# Patient Record
Sex: Female | Born: 1950
Health system: Southern US, Community
[De-identification: ages and names within clinical notes are randomized; demographics above are authoritative.]

## PROBLEM LIST (undated history)

## (undated) DIAGNOSIS — H524 Presbyopia: Secondary | ICD-10-CM

## (undated) DIAGNOSIS — H35369 Drusen (degenerative) of macula, unspecified eye: Secondary | ICD-10-CM

## (undated) DIAGNOSIS — M25552 Pain in left hip: Secondary | ICD-10-CM

## (undated) DIAGNOSIS — E878 Other disorders of electrolyte and fluid balance, not elsewhere classified: Secondary | ICD-10-CM

## (undated) DIAGNOSIS — M8580 Other specified disorders of bone density and structure, unspecified site: Secondary | ICD-10-CM

## (undated) DIAGNOSIS — R002 Palpitations: Secondary | ICD-10-CM

## (undated) HISTORY — DX: Palpitations: R00.2

## (undated) HISTORY — DX: Other specified disorders of bone density and structure, unspecified site: M85.80

## (undated) HISTORY — DX: Other disorders of electrolyte and fluid balance, not elsewhere classified: E87.8

## (undated) HISTORY — DX: Pain in left hip: M25.552

## (undated) HISTORY — DX: Presbyopia: H52.4

## (undated) HISTORY — DX: Drusen (degenerative) of macula, unspecified eye: H35.369

## (undated) NOTE — Progress Notes (Signed)
 Formatting of this note is different from the original. Pessary Follow-up  Chief Complaint  Patient presents with  ? Follow-up   Subjective:  76 y.o. [id number] here for follow-up of pessary use.    She has a size 2.5 Gellhorn pessary in place for uterovaginal prolapse Does not take pessary out herself Continues Trimo-San, per patient preference. Not using vaginal estrogen. No bleeding, no pain  [place] in September and staying active in her living community.   Patient's medications, allergies, past obstetric/gynecologic, medical, surgical, social and family histories were reviewed and updated as appropriate.  Review of Systems Review of Systems - History obtained from the patient Patient questionnaire reviewed and scanned into patient chart if completed, All others negative.  Objective:   Vitals:   12/08/23 1106  BP: 122/68  Weight: 67.1 kg (148 lb)  Height: 157.5 cm (5' 2)   General:     Alert and oriented to person, place, and time, no distress  Head:  Normocephalic, without obvious abnormality  Pelvic: External: normal vulva without lesions Urethral Meatus: without prolapse Vagina: atrophic, no abrasions or lesions Cervix: no lesions Prolapse: grade 3 uterovaginal prolapse  Extremities:  Extremities normal, atraumatic, no cyanosis or edema  Skin: Skin color normal.  No rashes, or lesions  Neurologic:  Cranial nerves grossly intact   Assessment:   Charlotte Villa is a 59 y.o. presenting for follow-up of pessary use  Plan:   Grade 3 uterovaginal prolapse - continue size 2.5 Gellhorn pessary - continue Trimo-San - no concerns at this time - follow-up in 3 months  I spent a total of 20 minutes on 12/08/2023 reviewing the record, performing a face to face visit, preparing, discussing the treatment, and creating documentation of the encounter. This time was separate from that spent performing other billable services.  Follow Up:    Follow up in about 3 months (around 03/09/2024) for follow-up.  Jinnie Blunt, MD  Electronically signed by Blunt Jinnie, MD at 12/08/2023 11:39 AM CDT

---

## 2020-11-29 ENCOUNTER — Other Ambulatory Visit: Payer: Self-pay

## 2020-11-29 DIAGNOSIS — N816 Rectocele: Secondary | ICD-10-CM | POA: Diagnosis not present

## 2020-11-29 DIAGNOSIS — N8110 Cystocele, unspecified: Secondary | ICD-10-CM | POA: Diagnosis not present

## 2020-11-29 DIAGNOSIS — Z01419 Encounter for gynecological examination (general) (routine) without abnormal findings: Secondary | ICD-10-CM | POA: Diagnosis not present

## 2020-11-29 DIAGNOSIS — Z4689 Encounter for fitting and adjustment of other specified devices: Secondary | ICD-10-CM | POA: Diagnosis not present

## 2020-11-29 DIAGNOSIS — Z8739 Personal history of other diseases of the musculoskeletal system and connective tissue: Secondary | ICD-10-CM | POA: Diagnosis not present

## 2020-11-29 DIAGNOSIS — N812 Incomplete uterovaginal prolapse: Secondary | ICD-10-CM | POA: Diagnosis not present

## 2020-11-29 MED ORDER — TRIMO-SAN 0.025-0.01 % VA GEL
1.0000 | VAGINAL | 2 refills | Status: DC
  Filled 2020-11-29: qty 113.4, 90d supply, fill #0
  Filled 2020-12-27: qty 113.4, 28d supply, fill #0

## 2020-12-03 ENCOUNTER — Other Ambulatory Visit: Payer: Self-pay

## 2020-12-04 ENCOUNTER — Other Ambulatory Visit: Payer: Self-pay | Admitting: Nurse Practitioner

## 2020-12-04 DIAGNOSIS — Z8739 Personal history of other diseases of the musculoskeletal system and connective tissue: Secondary | ICD-10-CM

## 2020-12-25 ENCOUNTER — Other Ambulatory Visit: Payer: Self-pay

## 2020-12-25 ENCOUNTER — Ambulatory Visit (INDEPENDENT_AMBULATORY_CARE_PROVIDER_SITE_OTHER): Payer: 59 | Admitting: Family Medicine

## 2020-12-25 VITALS — BP 118/80 | HR 93 | Ht 62.0 in | Wt 141.8 lb

## 2020-12-25 DIAGNOSIS — Z13220 Encounter for screening for lipoid disorders: Secondary | ICD-10-CM

## 2020-12-25 DIAGNOSIS — Z131 Encounter for screening for diabetes mellitus: Secondary | ICD-10-CM

## 2020-12-25 DIAGNOSIS — Z1159 Encounter for screening for other viral diseases: Secondary | ICD-10-CM

## 2020-12-25 DIAGNOSIS — Z7689 Persons encountering health services in other specified circumstances: Secondary | ICD-10-CM

## 2020-12-25 DIAGNOSIS — E878 Other disorders of electrolyte and fluid balance, not elsewhere classified: Secondary | ICD-10-CM | POA: Diagnosis not present

## 2020-12-25 NOTE — Progress Notes (Signed)
    SUBJECTIVE:   CHIEF COMPLAINT / HPI:   New patient visit  Current concerns None  PMH Long covid?-Patient reports she had COVID in June 2020, had issues with bilateral upper extremity weakness in winter 2020 and was seen by rheumatology who started her on taper of prednisone.  She took the prednisone for 8 months and symptoms resolved.  She has not taken it since then.  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 Occasional etoh, no other drugs. 4 grown sons. Exercises by walking at work. At least 4 miles per day.   Family history Maternal- adrenal cancer(not primary site but did not identify source)  Paternal- macular degeration, A fib, DVT Maternal aunt with lung cancer Maternal grandather with type 2 DM   Routine healthcare maintenance Colonoscopy-2021 Mammogram-2021 Pap smear-2009 DEXA scan-2021 Tetanus shot-2020 Pneumonia-2018 Shingles shot-2012 Echo-2019   OBJECTIVE:   BP 118/80   Pulse 93   Ht 5\' 2"  (1.575 m)   Wt 141 lb 12.8 oz (64.3 kg)   SpO2 95%   BMI 25.94 kg/m   Physical Exam Constitutional:      Appearance: She is normal weight.  HENT:     Head: Normocephalic and atraumatic.     Nose: Nose normal.  Eyes:     Extraocular Movements: Extraocular movements intact.     Pupils: Pupils are equal, round, and reactive to light.  Cardiovascular:     Rate and Rhythm: Normal rate and regular rhythm.  Pulmonary:     Effort: Pulmonary effort is normal.     Breath sounds: Normal breath sounds.  Abdominal:     General: Abdomen is flat. Bowel sounds are normal.     Palpations: Abdomen is soft.  Musculoskeletal:        General: No deformity. Normal range of motion.  Skin:    General: Skin is warm and dry.     Capillary Refill: Capillary refill takes less than 2 seconds.   Neurological:     General: No focal deficit present.     Mental Status: She is alert.     ASSESSMENT/PLAN:   Encounter to establish care Patient is here to establish care with new provider.  She has no current concerns at this time.  She is up-to-date on most of her vaccinations and screenings.  We will collect routine lipid panel screening for hyperlipidemia.  We will also check a BMP to assess kidney function and evaluate for electrolyte abnormalities.  We will also collect screening hepatitis labs.  Follow-up as needed.     , MD [facility]

## 2020-12-25 NOTE — Patient Instructions (Signed)
It was a pleasure meeting you today!  I am glad that you have no concerns today and you appear to be doing well.  We are checking some routine lab work such as a be met to assess your kidney function look at your electrolytes as well as a lipid panel to check your cholesterol.  We also screen all of our patients for hepatitis C.  I will call you if there are any abnormal results but otherwise we will post them to your MyChart.  Please activate your MyChart.  If you have any questions or concerns please feel free to call the clinic.  I hope you have a wonderful afternoon!

## 2020-12-25 NOTE — Assessment & Plan Note (Signed)
Patient is here to establish care with new provider.  She has no current concerns at this time.  She is up-to-date on most of her vaccinations and screenings.  We will collect routine lipid panel screening for hyperlipidemia.  We will also check a BMP to assess kidney function and evaluate for electrolyte abnormalities.  We will also collect screening hepatitis labs.  Follow-up as needed.

## 2020-12-26 ENCOUNTER — Telehealth: Payer: Self-pay | Admitting: Family Medicine

## 2020-12-26 ENCOUNTER — Encounter: Payer: Self-pay | Admitting: Family Medicine

## 2020-12-26 LAB — LIPID PANEL
Chol/HDL Ratio: 2.8 ratio (ref 0.0–4.4)
Cholesterol, Total: 236 mg/dL — ABNORMAL HIGH (ref 100–199)
HDL: 84 mg/dL (ref >39)
LDL Chol Calc (NIH): 131 mg/dL — ABNORMAL HIGH (ref 0–99)
Triglycerides: 120 mg/dL (ref 0–149)
VLDL Cholesterol Cal: 21 mg/dL (ref 5–40)

## 2020-12-26 LAB — BASIC METABOLIC PANEL
BUN/Creatinine Ratio: 17 (ref 12–28)
BUN: 11 mg/dL (ref 8–27)
CO2: 21 mmol/L (ref 20–29)
Calcium: 9.4 mg/dL (ref 8.7–10.3)
Chloride: 103 mmol/L (ref 96–106)
Creatinine, Ser: 0.66 mg/dL (ref 0.57–1.00)
Glucose: 119 mg/dL — ABNORMAL HIGH (ref 65–99)
Potassium: 3.7 mmol/L (ref 3.5–5.2)
Sodium: 140 mmol/L (ref 134–144)
eGFR: 94 mL/min/{1.73_m2} (ref >59)

## 2020-12-26 LAB — HEPATITIS C ANTIBODY: Hep C Virus Ab: <0.1 s/co ratio (ref 0.0–0.9)

## 2020-12-26 NOTE — Telephone Encounter (Signed)
Attempted to call patient regarding lab results.  Her cholesterol is mildly elevated in the 80s ASCVD risk calculator recommends initiating a statin mainly based off of her age.  Wanted to do shared decision making with the patient.  Will reattempt call later.

## 2020-12-27 ENCOUNTER — Other Ambulatory Visit: Payer: Self-pay

## 2020-12-27 DIAGNOSIS — Z4689 Encounter for fitting and adjustment of other specified devices: Secondary | ICD-10-CM | POA: Diagnosis not present

## 2020-12-28 ENCOUNTER — Other Ambulatory Visit: Payer: Self-pay

## 2020-12-28 ENCOUNTER — Ambulatory Visit
Admission: RE | Admit: 2020-12-28 | Discharge: 2020-12-28 | Disposition: A | Discharge status: Home / Self Care | Payer: 59 | Source: Ambulatory Visit | Attending: Nurse Practitioner | Admitting: Nurse Practitioner

## 2020-12-28 DIAGNOSIS — M8589 Other specified disorders of bone density and structure, multiple sites: Secondary | ICD-10-CM | POA: Diagnosis not present

## 2020-12-28 DIAGNOSIS — Z780 Asymptomatic menopausal state: Secondary | ICD-10-CM | POA: Diagnosis not present

## 2020-12-28 DIAGNOSIS — Z8739 Personal history of other diseases of the musculoskeletal system and connective tissue: Secondary | ICD-10-CM

## 2021-01-02 ENCOUNTER — Other Ambulatory Visit: Payer: Self-pay

## 2021-01-15 ENCOUNTER — Other Ambulatory Visit: Payer: Self-pay | Admitting: Nurse Practitioner

## 2021-02-27 DIAGNOSIS — Z4689 Encounter for fitting and adjustment of other specified devices: Secondary | ICD-10-CM | POA: Diagnosis not present

## 2021-04-04 ENCOUNTER — Other Ambulatory Visit: Payer: Self-pay | Admitting: Nurse Practitioner

## 2021-04-04 DIAGNOSIS — Z1231 Encounter for screening mammogram for malignant neoplasm of breast: Secondary | ICD-10-CM

## 2021-04-09 ENCOUNTER — Other Ambulatory Visit: Payer: Self-pay

## 2021-04-09 ENCOUNTER — Ambulatory Visit: Payer: Medicare Other | Attending: Internal Medicine

## 2021-04-09 DIAGNOSIS — Z23 Encounter for immunization: Secondary | ICD-10-CM

## 2021-04-09 MED ORDER — PFIZER COVID-19 VAC BIVALENT 30 MCG/0.3ML IM SUSP
INTRAMUSCULAR | 0 refills | Status: DC
  Filled 2021-04-09: qty 0.3, 1d supply, fill #0

## 2021-04-09 NOTE — Progress Notes (Signed)
   Covid-19 Vaccination Clinic  Name:  Charlotte Villa    MRN: [id number] DOB: [birth year]  04/09/2021  [name] was observed post Covid-19 immunization for 15 minutes without incident. She was provided with Vaccine Information Sheet and instruction to access the V-Safe system.   [name] was instructed to call 911 with any severe reactions post vaccine: Difficulty breathing  Swelling of face and throat  A fast heartbeat  A bad rash all over body  Dizziness and weakness   Immunizations Administered     Name Date Dose VIS Date Route   Pfizer Covid-19 Vaccine Bivalent Booster 04/09/2021  1:33 PM 0.3 mL 02/06/2021 Intramuscular   Manufacturer: [employer], [employer]   Lot: [id number]   NDC: [address]-0      Drusilla Kanner, PharmD, MBA Clinical Acute Care Pharmacist

## 2021-04-24 DIAGNOSIS — N8110 Cystocele, unspecified: Secondary | ICD-10-CM | POA: Diagnosis not present

## 2021-04-24 DIAGNOSIS — Z4689 Encounter for fitting and adjustment of other specified devices: Secondary | ICD-10-CM | POA: Diagnosis not present

## 2021-04-24 DIAGNOSIS — N816 Rectocele: Secondary | ICD-10-CM | POA: Diagnosis not present

## 2021-04-24 DIAGNOSIS — N812 Incomplete uterovaginal prolapse: Secondary | ICD-10-CM | POA: Diagnosis not present

## 2021-05-07 ENCOUNTER — Other Ambulatory Visit: Payer: Self-pay

## 2021-05-07 ENCOUNTER — Ambulatory Visit
Admission: RE | Admit: 2021-05-07 | Discharge: 2021-05-07 | Disposition: A | Discharge status: Home / Self Care | Payer: Medicare Other | Source: Ambulatory Visit | Attending: Nurse Practitioner | Admitting: Nurse Practitioner

## 2021-05-07 DIAGNOSIS — Z1231 Encounter for screening mammogram for malignant neoplasm of breast: Secondary | ICD-10-CM

## 2021-06-12 ENCOUNTER — Telehealth: Payer: Self-pay

## 2021-06-12 NOTE — Telephone Encounter (Signed)
Patient calls nurse line reporting flu like sxs for the last couple of days. Patient reports congestion with "yellowish" phlegm, body aches and "mild" chills. Patient reports she took a covid test yesterday and was negative.  Patient denies fevers, SOB, nausea/vomiting or diarrhea.   Patient scheduled for tomorrow for evaluation.

## 2021-06-13 ENCOUNTER — Other Ambulatory Visit: Payer: Self-pay

## 2021-06-13 ENCOUNTER — Ambulatory Visit (INDEPENDENT_AMBULATORY_CARE_PROVIDER_SITE_OTHER): Payer: 59 | Admitting: Family Medicine

## 2021-06-13 VITALS — BP 127/74 | HR 95 | Temp 98.5°F | Ht 62.0 in | Wt 140.4 lb

## 2021-06-13 DIAGNOSIS — J069 Acute upper respiratory infection, unspecified: Secondary | ICD-10-CM | POA: Diagnosis not present

## 2021-06-13 DIAGNOSIS — R6889 Other general symptoms and signs: Secondary | ICD-10-CM

## 2021-06-13 MED ORDER — BENZONATATE 100 MG PO CAPS: 100.0000 mg | ORAL_CAPSULE | Freq: Two times a day (BID) | ORAL | 0 refills | Status: DC | PRN

## 2021-06-13 NOTE — Progress Notes (Signed)
° ° °  SUBJECTIVE:   CHIEF COMPLAINT / HPI: cough  Scratchy throat and feeling unwell on Sunday morning.  Worse following day with nonproductie cough and chills.  Temp 98-98.2.  Runny nose.  Denies any chest pain, shortness of breath, nausea, vomiting or diarrhea.  No recent sick contacts.  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  PERTINENT  PMH / PSH:  None  OBJECTIVE:   BP 127/74    Pulse 95    Temp 98.5 F (36.9 C) (Oral)    Ht 5\' 2"  (1.575 m)    Wt 140 lb 6.4 oz (63.7 kg)    SpO2 94%    BMI 25.68 kg/m    General: Alert, no acute distress Cardio: Normal S1 and S2, RRR, no r/m/g Pulm: CTAB, normal work of breathing Abdomen: Bowel sounds normal. Abdomen soft and non-tender.  ASSESSMENT/PLAN:   Viral URI Likely viral etiology given rhinorrhea and lungs clear on auscultation. -COVID/FLU/RS today -Continue to self isolate until resulted -Symptomatic management -Tessalon Pearls 100 mg BID  -Continue to hydrate -Strict return precautions provided -Follow up with PCP as needed     Carollee Leitz, MD [place]

## 2021-06-13 NOTE — Patient Instructions (Addendum)
Thank you for coming to see me today. It was a pleasure.  COVID/ Flu and RSV testing today.  Will MyChart you result.  Honey tea for cough  Tessalon Pearls twice a day  Hydrate daily  If you develop fevers>100.5, shortness of breath, chest pain, palpitations, dizziness, abdominal pain, nausea, vomiting, diarrhea or cannot eat or drink then please go to the ER immediately.   Please follow-up with PCP as needed  If you have any questions or concerns, please do not hesitate to call the office at [phone].  Best,   Carollee Leitz, MD    Upper Respiratory Infection, Adult An upper respiratory infection (URI) affects the nose, throat, and upper airways that lead to the lungs. The most common type of URI is often called the common cold. URIs usually get better on their own, without medical treatment. What are the causes? A URI is caused by a germ (virus). You may catch these germs by: Breathing in droplets from an infected person's cough or sneeze. Touching something that has the germ on it (is contaminated) and then touching your mouth, nose, or eyes. What increases the risk? You are more likely to get a URI if: You are very young or very old. You have close contact with others, such as at work, school, or a health care facility. You smoke. You have long-term (chronic) heart or lung disease. You have a weakened disease-fighting system (immune system). You have nasal allergies or asthma. You have a lot of stress. You have poor nutrition. What are the signs or symptoms? Runny or stuffy (congested) nose. Cough. Sneezing. Sore throat. Headache. Feeling tired (fatigue). Fever. Not wanting to eat as much as usual. Pain in your forehead, behind your eyes, and over your cheekbones (sinus pain). Muscle aches. Redness or irritation of the eyes. Pressure in the ears or face. How is this treated? URIs usually get better on their own within 7-10 days. Medicines cannot cure URIs, but  your doctor may recommend certain medicines to help relieve symptoms, such as: Over-the-counter cold medicines. Medicines to reduce coughing (cough suppressants). Coughing is a type of defense against infection that helps to clear the nose, throat, windpipe, and lungs (respiratory system). Take these medicines only as told by your doctor. Medicines to lower your fever. Follow these instructions at home: Activity Rest as needed. If you have a fever, stay home from work or school until your fever is gone, or until your doctor says you may return to work or school. You should stay home until you cannot spread the infection anymore (you are not contagious). Your doctor may have you wear a face mask so you have less risk of spreading the infection. Relieving symptoms Rinse your mouth often with salt water. To make salt water, dissolve -1 tsp (3-6 g) of salt in 1 cup (237 mL) of warm water. Use a cool-mist humidifier to add moisture to the air. This can help you breathe more easily. Eating and drinking  Drink enough fluid to keep your pee (urine) pale yellow. Eat soups and other clear broths. General instructions  Take over-the-counter and prescription medicines only as told by your doctor. Do not smoke or use any products that contain nicotine or tobacco. If you need help quitting, ask your doctor. Avoid being where people are smoking (avoid secondhand smoke). Stay up to date on all your shots (immunizations), and get the flu shot every year. Keep all follow-up visits. How to prevent the spread of infection to others  Wash your hands with soap and water for at least 20 seconds. If you cannot use soap and water, use hand sanitizer. Avoid touching your mouth, face, eyes, or nose. Cough or sneeze into a tissue or your sleeve or elbow. Do not cough or sneeze into your hand or into the air. Contact a doctor if: You are getting worse, not better. You have any of these: A fever or chills. Brown  or red mucus in your nose. Yellow or brown fluid (discharge)coming from your nose. Pain in your face, especially when you bend forward. Swollen neck glands. Pain when you swallow. White areas in the back of your throat. Get help right away if: You have shortness of breath that gets worse. You have very bad or constant: Headache. Ear pain. Pain in your forehead, behind your eyes, and over your cheekbones (sinus pain). Chest pain. You have long-lasting (chronic) lung disease along with any of these: Making high-pitched whistling sounds when you breathe, most often when you breathe out (wheezing). Long-lasting cough (more than 14 days). Coughing up blood. A change in your usual mucus. You have a stiff neck. You have changes in your: Vision. Hearing. Thinking. Mood. These symptoms may be an emergency. Get help right away. Call 911. Do not wait to see if the symptoms will go away. Do not drive yourself to the hospital. Summary An upper respiratory infection (URI) is caused by a germ (virus). The most common type of URI is often called the common cold. URIs usually get better within 7-10 days. Take over-the-counter and prescription medicines only as told by your doctor. This information is not intended to replace advice given to you by your health care provider. Make sure you discuss any questions you have with your health care provider. Document Revised: 12/26/2020 Document Reviewed: 12/26/2020 Elsevier Patient Education  [place].

## 2021-06-14 LAB — COVID-19, FLU A+B AND RSV
Influenza A, NAA: NOT DETECTED
Influenza B, NAA: NOT DETECTED
RSV, NAA: NOT DETECTED
SARS-CoV-2, NAA: NOT DETECTED

## 2021-06-15 ENCOUNTER — Encounter: Payer: Self-pay | Admitting: Family Medicine

## 2021-06-18 ENCOUNTER — Encounter: Payer: Self-pay | Admitting: Family Medicine

## 2021-06-18 DIAGNOSIS — J069 Acute upper respiratory infection, unspecified: Secondary | ICD-10-CM | POA: Insufficient documentation

## 2021-06-18 NOTE — Assessment & Plan Note (Signed)
Likely viral etiology given rhinorrhea and lungs clear on auscultation. -COVID/FLU/RS today -Continue to self isolate until resulted -Symptomatic management -Tessalon Pearls 100 mg BID  -Continue to hydrate -Strict return precautions provided -Follow up with PCP as needed

## 2021-11-12 ENCOUNTER — Encounter: Payer: Self-pay | Admitting: *Deleted

## 2021-11-21 DIAGNOSIS — H43392 Other vitreous opacities, left eye: Secondary | ICD-10-CM | POA: Diagnosis not present

## 2021-11-21 DIAGNOSIS — H35363 Drusen (degenerative) of macula, bilateral: Secondary | ICD-10-CM | POA: Diagnosis not present

## 2021-11-21 DIAGNOSIS — H2513 Age-related nuclear cataract, bilateral: Secondary | ICD-10-CM | POA: Diagnosis not present

## 2021-11-21 DIAGNOSIS — H524 Presbyopia: Secondary | ICD-10-CM | POA: Diagnosis not present

## 2021-11-21 DIAGNOSIS — Z7952 Long term (current) use of systemic steroids: Secondary | ICD-10-CM | POA: Diagnosis not present

## 2021-11-25 ENCOUNTER — Encounter: Payer: Self-pay | Admitting: Family Medicine

## 2021-11-25 ENCOUNTER — Ambulatory Visit (INDEPENDENT_AMBULATORY_CARE_PROVIDER_SITE_OTHER): Payer: 59 | Admitting: Family Medicine

## 2021-11-25 ENCOUNTER — Ambulatory Visit
Admission: RE | Admit: 2021-11-25 | Discharge: 2021-11-25 | Disposition: A | Discharge status: Home / Self Care | Payer: 59 | Source: Ambulatory Visit | Attending: Family Medicine | Admitting: Family Medicine

## 2021-11-25 VITALS — BP 112/68 | HR 74 | Wt 139.0 lb

## 2021-11-25 DIAGNOSIS — M25552 Pain in left hip: Secondary | ICD-10-CM | POA: Diagnosis not present

## 2021-11-25 DIAGNOSIS — M25551 Pain in right hip: Secondary | ICD-10-CM | POA: Diagnosis not present

## 2021-11-25 MED ORDER — NAPROXEN 500 MG PO TABS: 500.0000 mg | ORAL_TABLET | Freq: Two times a day (BID) | ORAL | 0 refills | Status: DC

## 2021-11-25 MED ORDER — DICLOFENAC SODIUM 1 % EX GEL: 4.0000 g | Freq: Four times a day (QID) | CUTANEOUS | 2 refills | Status: DC

## 2021-11-25 NOTE — Patient Instructions (Addendum)
Thank you for coming to see me today. It was a pleasure.   We will get some labs today.  If they are abnormal or we need to do something about them, I will call you.  If they are normal, I will send you a message on MyChart (if it is active) or a letter in the mail.  If you don't hear from [place] in 2 weeks, please call the office at the number below.   I have placed an order for Hip x-ray.  Please go to [facility] [employer] [facility] or at [facility] to have this completed.  You do not need an appointment, but if you would like to call them beforehand, their number is [phone].  We will contact you with your results afterwards.   Continue Tylenol 1300 mg 3 times a day Continue Naproxen 500 mg twice daily.  Do not take any Aleve, ibuprofen or other products containing NSAIDs while you are on this medication. Start diclofenac gel 4 times a day as needed Use heat and ice as needed Gentle hip exercises to start after results of hip x-rays received.  Please follow-up with PCP in 1 to 2 weeks  I will be at the [facility] in [place].   The address is [address] [place] The number is [phone]  If you wish to continue your primary care services with me please call the above number to schedule appointment.  Just tell them that you are currently a patient of Dr. Claris Che to schedule an appointment.   If you have any questions or concerns, please do not hesitate to call the office at [phone].  Best,   Dana Allan, MD    Hip Exercises Ask your health care provider which exercises are safe for you. Do exercises exactly as told by your health care provider and adjust them as directed. It is normal to feel mild stretching, pulling, tightness, or discomfort as you do these exercises. Stop right away if you feel sudden pain or your pain gets worse. Do not begin these exercises until told by your health care provider. Stretching and  range-of-motion exercises These exercises warm up your muscles and joints and improve the movement and flexibility of your hip. These exercises also help to relieve pain, numbness, and tingling. You may be asked to limit your range of motion if you had a hip replacement. Talk to your health care provider about these restrictions. Hamstrings, supine  Lie on your back (supine position). Loop a belt or towel over the ball of your left / right foot. The ball of your foot is on the walking surface, right under your toes. Straighten your left / right knee and slowly pull on the belt or towel to raise your leg until you feel a gentle stretch behind your knee (hamstring). Do not let your knee bend while you do this. Keep your other leg flat on the floor. Hold this position for __________ seconds. Slowly return your leg to the starting position. Repeat __________ times. Complete this exercise __________ times a day. Hip rotation  Lie on your back on a firm surface. With your left / right hand, gently pull your left / right knee toward the shoulder that is on the same side of the body. Stop when your knee is pointing toward the ceiling. Hold your left / right ankle with your other hand. Keeping your knee steady, gently pull your left / right ankle toward your other shoulder  until you feel a stretch in your buttocks. Keep your hips and shoulders firmly planted while you do this stretch. Hold this position for __________ seconds. Repeat __________ times. Complete this exercise __________ times a day. Seated stretch This exercise is sometimes called hamstrings and adductors stretch. Sit on the floor with your legs stretched wide. Keep your knees straight during this exercise. Keeping your head and back in a straight line, bend at your waist to reach for your left foot (position A). You should feel a stretch in your right inner thigh (adductors). Hold this position for __________ seconds. Then slowly  return to the upright position. Keeping your head and back in a straight line, bend at your waist to reach forward (position B). You should feel a stretch behind both of your thighs and knees (hamstrings). Hold this position for __________ seconds. Then slowly return to the upright position. Keeping your head and back in a straight line, bend at your waist to reach for your right foot (position C). You should feel a stretch in your left inner thigh (adductors). Hold this position for __________ seconds. Then slowly return to the upright position. Repeat __________ times. Complete this exercise __________ times a day. Lunge This exercise stretches the muscles of the hip (hip flexors). Place your left / right knee on the floor and bend your other knee so that is directly over your ankle. You should be half-kneeling. Keep good posture with your head over your shoulders. Tighten your buttocks to point your tailbone downward. This will prevent your back from arching too much. You should feel a gentle stretch in the front of your left / right thigh and hip. If you do not feel a stretch, slide your other foot forward slightly and then slowly lunge forward with your chest up until your knee once again lines up over your ankle. Make sure your tailbone continues to point downward. Hold this position for __________ seconds. Slowly return to the starting position. Repeat __________ times. Complete this exercise __________ times a day. Strengthening exercises These exercises build strength and endurance in your hip. Endurance is the ability to use your muscles for a long time, even after they get tired. Bridge This exercise strengthens the muscles of your hip (hip extensors). Lie on your back on a firm surface with your knees bent and your feet flat on the floor. Tighten your buttocks muscles and lift your bottom off the floor until the trunk of your body and your hips are level with your thighs. Do not  arch your back. You should feel the muscles working in your buttocks and the back of your thighs. If you do not feel these muscles, slide your feet 1-2 inches (2.5-5 cm) farther away from your buttocks. Hold this position for __________ seconds. Slowly lower your hips to the starting position. Let your muscles relax completely between repetitions. Repeat __________ times. Complete this exercise __________ times a day. Straight leg raises, side-lying This exercise strengthens the muscles that move the hip joint away from the center of the body (hip abductors). Lie on your side with your left / right leg in the top position. Lie so your head, shoulder, hip, and knee line up. You may bend your bottom knee slightly to help you balance. Roll your hips slightly forward, so your hips are stacked directly over each other and your left / right knee is facing forward. Leading with your heel, lift your top leg 4-6 inches (10-15 cm). You should feel the  muscles in your top hip lifting. Do not let your foot drift forward. Do not let your knee roll toward the ceiling. Hold this position for __________ seconds. Slowly return to the starting position. Let your muscles relax completely between repetitions. Repeat __________ times. Complete this exercise __________ times a day. Straight leg raises, side-lying This exercise strengthens the muscles that move the hip joint toward the center of the body (hip adductors). Lie on your side with your left / right leg in the bottom position. Lie so your head, shoulder, hip, and knee line up. You may place your upper foot in front to help you balance. Roll your hips slightly forward, so your hips are stacked directly over each other and your left / right knee is facing forward. Tense the muscles in your inner thigh and lift your bottom leg 4-6 inches (10-15 cm). Hold this position for __________ seconds. Slowly return to the starting position. Let your muscles relax  completely between repetitions. Repeat __________ times. Complete this exercise __________ times a day. Straight leg raises, supine This exercise strengthens the muscles in the front of your thigh (quadriceps). Lie on your back (supine position) with your left / right leg extended and your other knee bent. Tense the muscles in the front of your left / right thigh. You should see your kneecap slide up or see increased dimpling just above your knee. Keep these muscles tight as you raise your leg 4-6 inches (10-15 cm) off the floor. Do not let your knee bend. Hold this position for __________ seconds. Keep these muscles tense as you lower your leg. Relax the muscles slowly and completely between repetitions. Repeat __________ times. Complete this exercise __________ times a day. Hip abductors, standing This exercise strengthens the muscles that move the leg and hip joint away from the center of the body (hip abductors). Tie one end of a rubber exercise band or tubing to a secure surface, such as a chair, table, or pole. Loop the other end of the band or tubing around your left / right ankle. Keeping your ankle with the band or tubing directly opposite the secured end, step away until there is tension in the tubing or band. Hold on to a chair, table, or pole as needed for balance. Lift your left / right leg out to your side. While you do this: Keep your back upright. Keep your shoulders over your hips. Keep your toes pointing forward. Make sure to use your hip muscles to slowly lift your leg. Do not tip your body or forcefully lift your leg. Hold this position for __________ seconds. Slowly return to the starting position. Repeat __________ times. Complete this exercise __________ times a day. Squats This exercise strengthens the muscles in the front of your thigh (quadriceps). Stand in a door frame so your feet and knees are in line with the frame. You may place your hands on the frame for  balance. Slowly bend your knees and lower your hips like you are going to sit in a chair. Keep your lower legs in a straight-up-and-down position. Do not let your hips go lower than your knees. Do not bend your knees lower than told by your health care provider. If your hip pain increases, do not bend as low. Hold this position for ___________ seconds. Slowly push with your legs to return to standing. Do not use your hands to pull yourself to standing. Repeat __________ times. Complete this exercise __________ times a day. This information is not  intended to replace advice given to you by your health care provider. Make sure you discuss any questions you have with your health care provider. Document Revised: 10/10/2020 Document Reviewed: 10/10/2020 Elsevier Patient Education  [place].

## 2021-11-25 NOTE — Progress Notes (Signed)
    SUBJECTIVE:   CHIEF COMPLAINT / HPI: Left hip pain  Patient reports worsening left hip pain x1 month.  Woke up 1 morning with pain.  Has tried Tylenol and ibuprofen which had not helped.  She recently started using them in combination about a week ago and it has helped intermittently.  Denies any fevers, recent injury or previous injury, new exercises, incontinence of bowel or bladder, lower extremity weakness, numbness or tingling.  Reports bilateral upper extremity weakness.  States that she has been diagnosed with PMR in the past and has been on prednisone 7.5 mg daily until 2021 when it was discontinued by rheumatologist.  She denies any joint swelling, redness or stiffness.  PERTINENT  PMH / PSH:  History of PMR  OBJECTIVE:   BP 112/68   Pulse 74   Wt 139 lb (63 kg)   SpO2 97%   BMI 25.42 kg/m    General: Alert, no acute distress Bilateral arm exam No deformity. No tenderness to palpation FROM with 5/5 strength: Special Tests: Negative Tinel's sign, negative Phalen's test Neurovascular: Intact Sensation intact  Back - Normal skin, Spine with normal alignment and no deformity.  No tenderness to vertebral process palpation.  Paraspinous muscles are not tender and without spasm.   Range of motion is full at neck and lumbar sacral regions   Bilateral hip exam No deformity. FROM with 5/5 strength. No tenderness to palpation. Neurovascular: Intact Negative logroll Negative faber, fadir, and piriformis stretches.    ASSESSMENT/PLAN:   Left hip pain Suspect DJD given improvement with Tylenol and ibuprofen.  Most recent DEXA scan showed osteopenia.  Currently on vitamin D.  Given history of long-term steroid use possible fracture but doubt this given no recent injury. -Bilateral hip x-ray -Continue Tylenol 1300 mg 3 times daily -Start Naproxen 500 mg twice daily -Diclofenac gel 4 times daily as needed -CBC, CMET, CRP, CK, ESR, RF, anti-CCP, TSH to rule out  PMR. -Follow-up with results of hip x-ray -Consider physical therapy if normal x-ray results. -Follow-up with PCP in 1-2 weeks.     Carollee Leitz, MD [place]

## 2021-11-26 ENCOUNTER — Encounter: Payer: Self-pay | Admitting: Family Medicine

## 2021-11-26 DIAGNOSIS — M25552 Pain in left hip: Secondary | ICD-10-CM | POA: Insufficient documentation

## 2021-11-26 LAB — COMPREHENSIVE METABOLIC PANEL
ALT: 11 IU/L (ref 0–32)
AST: 20 IU/L (ref 0–40)
Albumin/Globulin Ratio: 2.3 — ABNORMAL HIGH (ref 1.2–2.2)
Albumin: 4.5 g/dL (ref 3.7–4.7)
Alkaline Phosphatase: 54 IU/L (ref 44–121)
BUN/Creatinine Ratio: 18 (ref 12–28)
BUN: 12 mg/dL (ref 8–27)
Bilirubin Total: 0.4 mg/dL (ref 0.0–1.2)
CO2: 19 mmol/L — ABNORMAL LOW (ref 20–29)
Calcium: 9.5 mg/dL (ref 8.7–10.3)
Chloride: 102 mmol/L (ref 96–106)
Creatinine, Ser: 0.68 mg/dL (ref 0.57–1.00)
Globulin, Total: 2 g/dL (ref 1.5–4.5)
Glucose: 90 mg/dL (ref 70–99)
Potassium: 4.7 mmol/L (ref 3.5–5.2)
Sodium: 135 mmol/L (ref 134–144)
Total Protein: 6.5 g/dL (ref 6.0–8.5)
eGFR: 92 mL/min/{1.73_m2} (ref >59)

## 2021-11-26 LAB — CBC WITH DIFFERENTIAL/PLATELET
Basophils Absolute: 0 10*3/uL (ref 0.0–0.2)
Basos: 1 %
EOS (ABSOLUTE): 0 10*3/uL (ref 0.0–0.4)
Eos: 1 %
Hematocrit: 34.2 % (ref 34.0–46.6)
Hemoglobin: 11.5 g/dL (ref 11.1–15.9)
Immature Grans (Abs): 0 10*3/uL (ref 0.0–0.1)
Immature Granulocytes: 0 %
Lymphocytes Absolute: 1.1 10*3/uL (ref 0.7–3.1)
Lymphs: 32 %
MCH: 32.3 pg (ref 26.6–33.0)
MCHC: 33.6 g/dL (ref 31.5–35.7)
MCV: 96 fL (ref 79–97)
Monocytes Absolute: 0.4 10*3/uL (ref 0.1–0.9)
Monocytes: 10 %
Neutrophils Absolute: 2 10*3/uL (ref 1.4–7.0)
Neutrophils: 56 %
Platelets: 200 10*3/uL (ref 150–450)
RBC: 3.56 x10E6/uL — ABNORMAL LOW (ref 3.77–5.28)
RDW: 12.2 % (ref 11.7–15.4)
WBC: 3.5 10*3/uL (ref 3.4–10.8)

## 2021-11-26 LAB — C-REACTIVE PROTEIN: CRP: 2 mg/L (ref 0–10)

## 2021-11-26 LAB — SEDIMENTATION RATE: Sed Rate: 3 mm/hr (ref 0–40)

## 2021-11-26 LAB — RHEUMATOID FACTOR: Rheumatoid fact SerPl-aCnc: 11.2 IU/mL (ref <14.0)

## 2021-11-26 LAB — CK: Total CK: 41 U/L (ref 32–182)

## 2021-11-26 NOTE — Assessment & Plan Note (Addendum)
Suspect DJD given improvement with Tylenol and ibuprofen.  Most recent DEXA scan showed osteopenia.  Currently on vitamin D.  Given history of long-term steroid use possible fracture but doubt this given no recent injury. -Bilateral hip x-ray -Continue Tylenol 1300 mg 3 times daily -Start Naproxen 500 mg twice daily -Diclofenac gel 4 times daily as needed -CBC, CMET, CRP, CK, ESR, RF, anti-CCP, TSH to rule out PMR. -Follow-up with results of hip x-ray -Consider physical therapy if normal x-ray results. -Follow-up with PCP in 1-2 weeks.

## 2021-12-04 DIAGNOSIS — Z4689 Encounter for fitting and adjustment of other specified devices: Secondary | ICD-10-CM | POA: Diagnosis not present

## 2021-12-04 DIAGNOSIS — N812 Incomplete uterovaginal prolapse: Secondary | ICD-10-CM | POA: Diagnosis not present

## 2021-12-04 DIAGNOSIS — Z01419 Encounter for gynecological examination (general) (routine) without abnormal findings: Secondary | ICD-10-CM | POA: Diagnosis not present

## 2021-12-04 LAB — ANTI-CCP AB, IGG + IGA (RDL): Anti-CCP Ab, IgG + IgA (RDL): <20 U

## 2021-12-04 LAB — TSH RFX ON ABNORMAL TO FREE T4: TSH: 2.31 u[IU]/mL (ref 0.450–4.500)

## 2021-12-12 ENCOUNTER — Encounter: Payer: Self-pay | Admitting: Student

## 2021-12-12 DIAGNOSIS — M8580 Other specified disorders of bone density and structure, unspecified site: Secondary | ICD-10-CM | POA: Insufficient documentation

## 2021-12-12 DIAGNOSIS — D649 Anemia, unspecified: Secondary | ICD-10-CM | POA: Insufficient documentation

## 2021-12-12 DIAGNOSIS — I341 Nonrheumatic mitral (valve) prolapse: Secondary | ICD-10-CM | POA: Insufficient documentation

## 2021-12-12 DIAGNOSIS — E785 Hyperlipidemia, unspecified: Secondary | ICD-10-CM | POA: Insufficient documentation

## 2021-12-23 ENCOUNTER — Ambulatory Visit (INDEPENDENT_AMBULATORY_CARE_PROVIDER_SITE_OTHER): Payer: 59 | Admitting: Family Medicine

## 2021-12-23 ENCOUNTER — Ambulatory Visit: Payer: 59

## 2021-12-23 VITALS — BP 142/72 | HR 86 | Ht 62.0 in | Wt 140.0 lb

## 2021-12-23 DIAGNOSIS — M25552 Pain in left hip: Secondary | ICD-10-CM | POA: Diagnosis not present

## 2021-12-23 NOTE — Assessment & Plan Note (Signed)
Patient's left hip exam seems to be more of a sartorius injury.  Different than usual.  We discussed with patient about thigh compression, home exercises, we discussed avoiding certain repetitive activities at the moment.  Patient's primary care has done relatively unremarkable work-up already at this moment.  We discussed other formal physical therapy but patient work with athletic trainer to learn home exercises in greater detail.  Worsening pain can consider advanced imaging.  Follow-up with me again in 6 to 8 weeks.

## 2021-12-23 NOTE — Progress Notes (Unsigned)
[facility] [address] [place] [address] Phone: [phone] Subjective:   Bruce Donath, am serving as a scribe for Dr. Antoine Primas.   I'm seeing this patient by the request  of:  Dana Allan, MD  CC: Left hip pain  [id number]  Bradleigh A Hartl is a 71 y.o. female coming in with complaint of L hip pain. Patient states that her pain began in May. Insidious onset. Located in the groin. Using Tylenol arthritis and volatren gel. Patient said that lying on side increases her pain. Patient walks a lot at work.   Xray hip 11/25/2021 FINDINGS: There is no evidence of hip fracture or dislocation of the hips. No acute displaced fracture or diastasis of the bones of pelvis. No aggressive appearing osseous lesion. Degenerative changes of the visualized lower lumbar spine.   IMPRESSION: Negative for acute traumatic injury. Reviewed the images in their entirety and agree with the findings.  Degenerative disc disease of the lumbar spine is noted    No past medical history on file. No past surgical history on file. Social History   Socioeconomic History   Marital status: Divorced    Spouse name: Not on file   Number of children: Not on file   Years of education: Not on file   Highest education level: Not on file  Occupational History   Not on file  Tobacco Use   Smoking status: Never    Passive exposure: Never   Smokeless tobacco: Never  Substance and Sexual Activity   Alcohol use: Not on file   Drug use: Not on file   Sexual activity: Not on file  Other Topics Concern   Not on file  Social History Narrative   Not on file   Social Determinants of Health   Financial Resource Strain: Not on file  Food Insecurity: Not on file  Transportation Needs: Not on file  Physical Activity: Not on file  Stress: Not on file  Social Connections: Not on file   No Known Allergies No family history on file.    Current Outpatient  Medications (Respiratory):    benzonatate (TESSALON) 100 MG capsule, Take 1 capsule (100 mg total) by mouth 2 (two) times daily as needed for cough.  Current Outpatient Medications (Analgesics):    naproxen (NAPROSYN) 500 MG tablet, Take 1 tablet (500 mg total) by mouth 2 (two) times daily with a meal.   Current Outpatient Medications (Other):    COVID-19 mRNA bivalent vaccine, Pfizer, (PFIZER COVID-19 VAC BIVALENT) injection, Inject into the muscle.   diclofenac Sodium (VOLTAREN) 1 % GEL, Apply 4 g topically 4 (four) times daily.   Reviewed prior external information including notes and imaging from  primary care provider As well as notes that were available from care everywhere and other healthcare systems.  Reviewed patient's primary care visits and treatment that has already been established for her hip pain  Past medical history, social, surgical and family history all reviewed in electronic medical record.  No pertanent information unless stated regarding to the chief complaint.   Review of Systems:  No headache, visual changes, nausea, vomiting, diarrhea, constipation, dizziness, abdominal pain, skin rash, fevers, chills, night sweats, weight loss, swollen lymph nodes, body aches, joint swelling, chest pain, shortness of breath, mood changes. POSITIVE muscle aches  Objective  Blood pressure (!) 142/72, pulse 86, height 5\' 2"  (1.575 m), weight 140 lb (63.5 kg), SpO2 99 %.   General: No apparent distress alert and  oriented x3 mood and affect normal, dressed appropriately.  HEENT: Pupils equal, extraocular movements intact  Respiratory: Patient's speak in full sentences and does not appear short of breath  Cardiovascular: No lower extremity edema, non tender, no erythema  Left hip exam seems to be more on the anterior aspect.  Patient does have good internal and external range of motion though noted.  Negative straight leg test.  Patient though does have pain with external rotation of  the hip with forward flexion against resistance.  Patient also states that there is some discomfort noted at the medial aspect of the knee.  Neurovascular intact distally.  Deep tendon reflexes intact   [address]; 15 additional minutes spent for Therapeutic exercises as stated in above notes.  This included exercises focusing on stretching, strengthening, with significant focus on eccentric aspects.   Long term goals include an improvement in range of motion, strength, endurance as well as avoiding reinjury. Patient's frequency would include in 1-2 times a day, 3-5 times a week for a duration of 6-12 weeks.  Hip strengthening exercises which included:  Pelvic tilt/bracing to help with proper recruitment of the lower abs and pelvic floor muscles  Glute strengthening to properly contract glutes without over-engaging low back and hamstrings - prone hip extension and glute bridge exercises Proper stretching techniques to increase effectiveness for the hip flexors, groin, quads, piriformic and low back when appropriate  Proper technique shown and discussed handout in great detail with ATC.  All questions were discussed and answered.     Impression and Recommendations:    The above documentation has been reviewed and is accurate and complete Judi Saa, DO

## 2021-12-23 NOTE — Patient Instructions (Signed)
Thigh compression sleeve Ice 20x 2x a day Decrease Tyelnol 650mg  daily Voltaren gel See me again in 6 weeks

## 2022-02-03 NOTE — Progress Notes (Unsigned)
[facility] [address] [place] [address] Phone: [phone] Subjective:   INadine Counts, am serving as a scribe for Dr. Antoine Primas.  I'm seeing this patient by the request  of:  Dana Allan, MD  CC: Left hip pain follow-up  [id number]  12/23/2021 Patient's left hip exam seems to be more of a sartorius injury.  Different than usual.  We discussed with patient about thigh compression, home exercises, we discussed avoiding certain repetitive activities at the moment.  Patient's primary care has done relatively unremarkable work-up already at this moment.  We discussed other formal physical therapy but patient work with athletic trainer to learn home exercises in greater detail.  Worsening pain can consider advanced imaging.  Follow-up with me again in 6 to 8 weeks.  Update 02/04/2022 JELESA MANGINI is a 71 y.o. female coming in with complaint of L hip pain. Patient states hip is feeling much better. Not sure why, but feeling great. Only time having pain is when laying on left side for too long, will wake her at night. Exercises causing some knee discomfort. Talked with her about modifying stretches.       No past medical history on file. No past surgical history on file. Social History   Socioeconomic History   Marital status: Divorced    Spouse name: Not on file   Number of children: Not on file   Years of education: Not on file   Highest education level: Not on file  Occupational History   Not on file  Tobacco Use   Smoking status: Never    Passive exposure: Never   Smokeless tobacco: Never  Substance and Sexual Activity   Alcohol use: Not on file   Drug use: Not on file   Sexual activity: Not on file  Other Topics Concern   Not on file  Social History Narrative   Not on file   Social Determinants of Health   Financial Resource Strain: Not on file  Food Insecurity: Not on file  Transportation Needs: Not on file   Physical Activity: Not on file  Stress: Not on file  Social Connections: Not on file   No Known Allergies No family history on file.    Current Outpatient Medications (Respiratory):    benzonatate (TESSALON) 100 MG capsule, Take 1 capsule (100 mg total) by mouth 2 (two) times daily as needed for cough.  Current Outpatient Medications (Analgesics):    naproxen (NAPROSYN) 500 MG tablet, Take 1 tablet (500 mg total) by mouth 2 (two) times daily with a meal.   Current Outpatient Medications (Other):    COVID-19 mRNA bivalent vaccine, Pfizer, (PFIZER COVID-19 VAC BIVALENT) injection, Inject into the muscle.   diclofenac Sodium (VOLTAREN) 1 % GEL, Apply 4 g topically 4 (four) times daily.    Review of Systems:  No headache, visual changes, nausea, vomiting, diarrhea, constipation, dizziness, abdominal pain, skin rash, fevers, chills, night sweats, weight loss, swollen lymph nodes, body aches, joint swelling, chest pain, shortness of breath, mood changes. POSITIVE muscle aches  Objective  Blood pressure 112/64, pulse 94, height 5\' 2"  (1.575 m), weight 137 lb (62.1 kg), SpO2 97 %.   General: No apparent distress alert and oriented x3 mood and affect normal, dressed appropriately.  HEENT: Pupils equal, extraocular movements intact  Respiratory: Patient's speak in full sentences and does not appear short of breath  Cardiovascular: No lower extremity edema, non tender, no erythema  Left hip exam does  show that patient does have relatively good range of motion.  Nontender on exam on the lateral aspect of the hip.  Patient has negative straight leg test.  Able to get out of a seated position without any difficulty.    Impression and Recommendations:     The above documentation has been reviewed and is accurate and complete Charlotte Saa, DO

## 2022-02-04 ENCOUNTER — Ambulatory Visit (INDEPENDENT_AMBULATORY_CARE_PROVIDER_SITE_OTHER): Payer: 59 | Admitting: Family Medicine

## 2022-02-04 DIAGNOSIS — M25552 Pain in left hip: Secondary | ICD-10-CM

## 2022-02-04 NOTE — Patient Instructions (Signed)
Ice before bed See me in 2 months

## 2022-02-04 NOTE — Assessment & Plan Note (Signed)
Significant improvement at this time.  Patient has done all the exercises.  Still having some pain.  Continue the topical anti-inflammatories and icing regimen.  Follow-up with me again in 2 months if not completely resolved.  At that time if continued to have pain we can consider the possibility of a greater trochanteric injection.

## 2022-03-27 ENCOUNTER — Ambulatory Visit (HOSPITAL_COMMUNITY): Payer: 59 | Attending: Family Medicine

## 2022-03-27 ENCOUNTER — Other Ambulatory Visit: Payer: Self-pay

## 2022-03-27 ENCOUNTER — Ambulatory Visit (INDEPENDENT_AMBULATORY_CARE_PROVIDER_SITE_OTHER): Payer: 59 | Admitting: Family Medicine

## 2022-03-27 ENCOUNTER — Ambulatory Visit (INDEPENDENT_AMBULATORY_CARE_PROVIDER_SITE_OTHER): Payer: 59

## 2022-03-27 VITALS — BP 124/70 | HR 99 | Wt 141.4 lb

## 2022-03-27 DIAGNOSIS — R002 Palpitations: Secondary | ICD-10-CM

## 2022-03-27 DIAGNOSIS — R221 Localized swelling, mass and lump, neck: Secondary | ICD-10-CM | POA: Diagnosis not present

## 2022-03-27 DIAGNOSIS — I341 Nonrheumatic mitral (valve) prolapse: Secondary | ICD-10-CM

## 2022-03-27 NOTE — Progress Notes (Deleted)
    SUBJECTIVE:   CHIEF COMPLAINT / HPI:   Charlotte Villa is a 71 y.o. female who presents to the [facility] clinic today to discuss the following concerns:   Palpitations   PERTINENT  PMH / PSH: MVP, Osteopenia  OBJECTIVE:   BP (!) 170/95   Pulse 99   Wt 141 lb 6.4 oz (64.1 kg)   SpO2 98%   BMI 25.86 kg/m    General: NAD, pleasant, able to participate in exam Cardiac: RRR, no murmurs. Respiratory: CTAB, normal effort, No wheezes, rales or rhonchi Abdomen: Bowel sounds present, nontender, nondistended, no hepatosplenomegaly. Extremities: no edema or cyanosis. Skin: warm and dry, no rashes noted Neuro: alert, no obvious focal deficits Psych: Normal affect and mood  ASSESSMENT/PLAN:   No problem-specific Assessment & Plan notes found for this encounter.   Palpitations Ongoing since ***. Pt has hx of mitral valve prolapse which could be contributing, unable to find any echos to review. Other differentials includes new arrhythmia, PAC/PVC's, high output cardiac states, psychiatric causes (anxiety, panic attacks), endocrine dosriders or metabolic abnormalities. Medications reviewed *** and likely non-contributory. EKG obtained in office today and ***.  -CBC, TSH, BMP -Echo -Referral to Cardiology    Sharion Settler, [place]

## 2022-03-27 NOTE — Progress Notes (Addendum)
SUBJECTIVE:   CHIEF COMPLAINT / HPI:   Charlotte Villa is a 71 y.o. female with a past medical history of mitral valve prolapse, HLD, and osteopenia presenting to the [facility] for new onset palpitations.  Palpitation The patient has had feelings of heart palpitations for a few seconds a few times a day most days since June 2023.  She purchased a CardioMobile 2 lead EKG sensor with associated phone app in June.  She experienced an episode of unremitting palpitations for 2.5 hours this past Sunday for the first time and recorded the episode on her app.  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  The patient had a similar, shorter episode of palpitation Monday morning and has continued to experience brief palpitation episodes periodically since.  During her palpitation episodes, the patient denies chest pain, shortness of breath, arm pain, diaphoresis, and nausea/vomiting.  She denies exertional dyspnea, PND, and orthopnea in general.   PERTINENT  PMH / PSH / FH: The patient has a history of mitral valve prolapse but does not follow with cardiology.  Her last echo was in 2003 in [place], when she was told by cardiology that she does not require regular follow up for her MVP.  She had COVID-19 in September.  Her father had afib, but the patient has no other cardiac family history to report.  OBJECTIVE:   BP 124/70   Pulse 99   Wt 141 lb 6.4 oz (64.1 kg)   SpO2 98%   BMI 25.86 kg/m   General: Age-appropriate, resting comfortably in chair, NAD, WNWD, alert and at baseline. Neck: Supple. Thyroid smooth. Palpable unilateral 2 cm round nodule on deep palpation of right lateral neck, likely posterior cervical lymph node. Nodule is mobile and nontender on palpation. Cardiovascular: Regular rate and rhythm. Normal S1/S2. II/VI diastolic murmur best heard at the left 5th intercostal space  midclavicular line.  No rubs, or gallops appreciated.  2+ radial pulses. Pulmonary: Clear bilaterally to ascultation. No increased WOB, no accessory muscle usage. No wheezes, rales, or crackles. Skin: Warm and dry. Extremities: No peripheral edema bilaterally.  EKG: Normal sinus rhythm, regular rate, occasional unifocal PVC noted, no ST elevations or depressions. There are no previous tracings available for comparison.  ASSESSMENT/PLAN:   Palpitation This issue is ongoing since June and is not currently for emergent risk to patient safety.  The patient has a history of mitral valve prolapse which could be contributing, unable to find any echos to review.  Other differentials include new arrhythmia, PAC/PVC's, high output cardiac states, psychiatric causes (anxiety, panic attacks), endocrine dosriders or metabolic abnormalities.  Benign PVCs seem plausible, but further workup with 48 hour cardiac monitoring is necessary to rule out concerning pathologies.  Medications reviewed and non-contributory as patient only takes acetaminophen. EKG obtained in office today and showed normal sinus rhythm, regular rate, occasional PVCs. - CBC, TSH, BMP ordered - 48 hour Holter monitor ordered - Echo ordered to evaluate for structural pathologies - Referral to Cardiology placed  Neck nodule This incidentally found nodule on the patient's neck is nontender, mobile, and lateral to the thyroid.  Overall, this is consistent with an enlarged anterior cervical lymph node, but it is important to consider malignancy in this 71 year old patient.  Will follow up on the size and location of the nodule at the patient's next appointment and  continue to monitor with possible imaging as a next step.    [facility] doctor, Medical Student [facility]   I was personally present and performed or re-performed the history, physical exam and medical decision making activities of this service and have  verified that the service and findings are accurately documented in the student's note.  Sabino Dick, DO                  03/27/2022, 5:32 PM

## 2022-03-27 NOTE — Patient Instructions (Addendum)
It was wonderful to see you today.  Today we talked about:  We are doing lab work today to check your blood count, electrolytes, thyroid level. I am ordered an ultrasound of your heart to check on the valves in your heart and your heart pumping function. I have also sent a referral to Cardiology for additional recommendations.   Thank you for choosing [facility].   Please call [phone] with any questions about today's appointment.  Please be sure to schedule follow up at the front  desk before you leave today.   Sharion Settler, DO PGY-3 Family Medicine

## 2022-03-27 NOTE — Assessment & Plan Note (Signed)
This issue is ongoing since June and is not currently for emergent risk to patient safety.  The patient has a history of mitral valve prolapse which could be contributing, unable to find any echos to review.  Other differentials include new arrhythmia, PAC/PVC's, high output cardiac states, psychiatric causes (anxiety, panic attacks), endocrine dosriders or metabolic abnormalities.  Benign PVCs seem plausible, but further workup with 48 hour cardiac monitoring is necessary to rule out concerning pathologies.  Medications reviewed and non-contributory as patient only takes acetaminophen. EKG obtained in office today and showed normal sinus rhythm, regular rate, occasional PVCs. - CBC, TSH, BMP ordered - 48 hour Holter monitor ordered - Echo ordered to evaluate for structural pathologies - Referral to Cardiology placed

## 2022-03-27 NOTE — Assessment & Plan Note (Addendum)
This incidentally found nodule on the patient's neck is nontender, mobile, and lateral to the thyroid.  Overall, this is consistent with an enlarged anterior cervical lymph node, but it is important to consider malignancy in this 71 year old patient.  Will follow up on the size and location of the nodule at the patient's next appointment and continue to monitor with possible imaging as a next step.

## 2022-03-27 NOTE — Progress Notes (Unsigned)
Enrolled for [employer] to mail a ZIO XT long term holter monitor to the patients address on file.   DOD to read. 

## 2022-03-28 LAB — BASIC METABOLIC PANEL
BUN/Creatinine Ratio: 16 (ref 12–28)
BUN: 16 mg/dL (ref 8–27)
CO2: 24 mmol/L (ref 20–29)
Calcium: 9.7 mg/dL (ref 8.7–10.3)
Chloride: 103 mmol/L (ref 96–106)
Creatinine, Ser: 1 mg/dL (ref 0.57–1.00)
Glucose: 101 mg/dL — ABNORMAL HIGH (ref 70–99)
Potassium: 4.2 mmol/L (ref 3.5–5.2)
Sodium: 141 mmol/L (ref 134–144)
eGFR: 59 mL/min/{1.73_m2} — ABNORMAL LOW (ref >59)

## 2022-03-28 LAB — CBC
Hematocrit: 36.6 % (ref 34.0–46.6)
Hemoglobin: 12.4 g/dL (ref 11.1–15.9)
MCH: 32.3 pg (ref 26.6–33.0)
MCHC: 33.9 g/dL (ref 31.5–35.7)
MCV: 95 fL (ref 79–97)
Platelets: 230 10*3/uL (ref 150–450)
RBC: 3.84 x10E6/uL (ref 3.77–5.28)
RDW: 11.5 % — ABNORMAL LOW (ref 11.7–15.4)
WBC: 4.2 10*3/uL (ref 3.4–10.8)

## 2022-03-28 LAB — TSH RFX ON ABNORMAL TO FREE T4: TSH: 1.69 u[IU]/mL (ref 0.450–4.500)

## 2022-04-01 DIAGNOSIS — R002 Palpitations: Secondary | ICD-10-CM | POA: Diagnosis not present

## 2022-04-02 ENCOUNTER — Telehealth: Payer: Self-pay | Admitting: *Deleted

## 2022-04-02 NOTE — Telephone Encounter (Signed)
-----   Message from Sharion Settler, DO sent at 03/27/2022  7:24 PM EDT ----- Regarding: Please Schedule Echo Hi Team,  I feel like I forgot to mention this in clinic but I did order an Echo for this patient for her palpitations. Could you please schedule an call or MyChart her with the date, time and location?  Thank you so much! Ale

## 2022-04-02 NOTE — Telephone Encounter (Signed)
Pt informed. Arvilla Salada, CMA  

## 2022-04-07 ENCOUNTER — Ambulatory Visit (HOSPITAL_COMMUNITY)
Admission: RE | Admit: 2022-04-07 | Discharge: 2022-04-07 | Disposition: A | Discharge status: Home / Self Care | Payer: 59 | Source: Ambulatory Visit | Attending: Family Medicine | Admitting: Family Medicine

## 2022-04-07 ENCOUNTER — Ambulatory Visit: Payer: 59 | Admitting: Family Medicine

## 2022-04-07 DIAGNOSIS — E785 Hyperlipidemia, unspecified: Secondary | ICD-10-CM | POA: Insufficient documentation

## 2022-04-07 DIAGNOSIS — R002 Palpitations: Secondary | ICD-10-CM | POA: Diagnosis not present

## 2022-04-07 DIAGNOSIS — I341 Nonrheumatic mitral (valve) prolapse: Secondary | ICD-10-CM

## 2022-04-07 DIAGNOSIS — I340 Nonrheumatic mitral (valve) insufficiency: Secondary | ICD-10-CM | POA: Diagnosis not present

## 2022-04-07 LAB — ECHOCARDIOGRAM COMPLETE
Area-P 1/2: 2.96 cm2
Calc EF: 61.3 %
MV M vel: 5.1 m/s
MV Peak grad: 104 mmHg
MV VTI: 3.2 cm2
Radius: 0.4 cm
S' Lateral: 3.8 cm
Single Plane A2C EF: 64 %
Single Plane A4C EF: 56.5 %

## 2022-04-20 NOTE — Progress Notes (Unsigned)
Cardiology Office Note:    Date:  04/22/2022   ID:  Charlotte Villa, DOB 21-Sep-1950, MRN [id number]  PCP:  Glendale Chard, DO   [facility] Providers Cardiologist:  None {   Referring MD: Glendale Chard, DO    History of Present Illness:    Charlotte Villa is a 71 y.o. female with a hx of mitral valve prolapse who was referred by Dr. Hyacinth Meeker for further evaluation of palpitations.  Cardiac monitor 04/2022 with 6 beat run of NSVT. 5 episodes of nonsustained SVT and rare ectopy. TTE 20-Mar-2022 with LVEF 60-65%, G1DD, normal RV and RVSP, late systolic bileaflet MV prolapse with mild to moderate MR.  Today, the patient states that she began noticing palpitations in September of this year. They were initially brief but she had one episode in 2023-03-21 where she was laying in bed and suddenly her heart began racing. She got out of bed and used her Kardia device that showed possible Afib with HR 120-140s. Symptoms lasted for about 2 hours prior to resolving. Has had several further episodes but brief in nature. When wearing the cardiac monitor, she felt her PVCs but did not have the long palpitations like she had in 03-21-23. Her kardiac device strip was reviewed today and it does look like Afib.  Otherwise, she is doing well from a CV standpoint. No chest pain, orthopnea, PND, or LE edema. 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   Prior TTE in 2001 showed normal LV, normal LA, normal RV, with prolapse of both leaflets.   Past Medical History:  Diagnosis Date   Drusen of macula    Electrolyte abnormality    Left hip pain    Osteopenia    Palpitations    Presbyopia     No past surgical history on file.  Current Medications: Current Meds  Medication Sig   acetaminophen (TYLENOL) 650 MG CR tablet Take 650 mg by mouth every morning.   COVID-19 mRNA bivalent vaccine, Pfizer, (PFIZER COVID-19 VAC BIVALENT) injection Inject into the muscle.     Allergies:   Patient has no  known allergies.   Social History   Socioeconomic History   Marital status: Divorced    Spouse name: Not on file   Number of children: Not on file   Years of education: Not on file   Highest education level: Not on file  Occupational History   Not on file  Tobacco Use   Smoking status: Never    Passive exposure: Never   Smokeless tobacco: Never  Substance and Sexual Activity   Alcohol use: Not on file   Drug use: Not on file   Sexual activity: Not on file  Other Topics Concern   Not on file  Social History Narrative   Not on file   Social Determinants of Health   Financial Resource Strain: Not on file  Food Insecurity: Not on file  Transportation Needs: Not on file  Physical Activity: Not on file  Stress: Not on file  Social Connections: Not on file     Family History: The patient's family history is not on file.  ROS:   Please see the history of present illness.     All other systems reviewed and are negative.  EKGs/Labs/Other Studies Reviewed:    The following studies were reviewed today: TTE Mar 20, 2022: IMPRESSIONS   1. Late systolic bileaflet prolapse. Mild to moderate MR. The mitral  valve is abnormal. Mild to moderate mitral valve  regurgitation. No  evidence of mitral stenosis. There is moderate late systolic prolapse of  both leaflets of the mitral valve.   2. Left ventricular ejection fraction, by estimation, is 60 to 65%. Left  ventricular ejection fraction by 2D MOD biplane is 61.3 %. The left  ventricle has normal function. The left ventricle has no regional wall  motion abnormalities. Left ventricular  diastolic parameters are consistent with Grade I diastolic dysfunction  (impaired relaxation).   3. Right ventricular systolic function is normal. The right ventricular  size is normal. There is normal pulmonary artery systolic pressure. The  estimated right ventricular systolic pressure is 16.4 mmHg.   4. The aortic valve is tricuspid. Aortic valve  regurgitation is not  visualized. No aortic stenosis is present.   5. The inferior vena cava is normal in size with greater than 50%  respiratory variability, suggesting right atrial pressure of 3 mmHg.   Cardiac Monitor 04/11/22:   Predominately sinus rhythm   1 brief run of nonsustained VT lasting 6 beats with avg HR of 139   Rare ( 5 episodes ) of supraventricular tachycardia, brief episodes .  fastest episode was 11 beats at rate if 169   Rare PVCs, PACs     Patch Wear Time:  2 days and 23 hours (2023-10-24T06:58:47-0400 to 2023-10-27T06:37:45-0400)   Patient had a min HR of 53 bpm, max HR of 169 bpm, and avg HR of 79 bpm. Predominant underlying rhythm was Sinus Rhythm. 1 run of Ventricular Tachycardia occurred lasting 6 beats with a max rate of 154 bpm (avg 139 bpm). 5 Supraventricular Tachycardia  runs occurred, the run with the fastest interval lasting 11 beats with a max rate of 169 bpm, the longest lasting 14 beats with an avg rate of 134 bpm. Isolated SVEs were rare (<1.0%), SVE Couplets were rare (<1.0%), and SVE Triplets were rare (<1.0%).  Isolated VEs were occasional (3.1%, 10227), VE Couplets were rare (<1.0%, 789), and VE Triplets were rare (<1.0%, 25). Ventricular Bigeminy and Trigeminy were present.   EKG:  EKG 03/27/22 personally reviewed: NSR with PVCs  Recent Labs: 11/25/2021: ALT 11 03/27/2022: BUN 16; Creatinine, Ser 1.00; Hemoglobin 12.4; Platelets 230; Potassium 4.2; Sodium 141; TSH 1.690  Recent Lipid Panel    Component Value Date/Time   CHOL 236 (H) 12/25/2020 1609   TRIG 120 12/25/2020 1609   HDL 84 12/25/2020 1609   CHOLHDL 2.8 12/25/2020 1609   LDLCALC 131 (H) 12/25/2020 1609     Risk Assessment/Calculations:                Physical Exam:    VS:  BP 126/78   Pulse 79   Ht 5\' 2"  (1.575 m)   Wt 138 lb 9.6 oz (62.9 kg)   SpO2 98%   BMI 25.35 kg/m     Wt Readings from Last 3 Encounters:  04/22/22 138 lb 9.6 oz (62.9 kg)  03/27/22 141 lb 6.4  oz (64.1 kg)  02/04/22 137 lb (62.1 kg)     GEN:  Well nourished, well developed in no acute distress HEENT: Normal NECK: No JVD; No carotid bruits CARDIAC: RRR, 2/6 late systolic murmur RESPIRATORY:  Clear to auscultation without rales, wheezing or rhonchi  ABDOMEN: Soft, non-tender, non-distended MUSCULOSKELETAL:  No edema; No deformity  SKIN: Warm and dry NEUROLOGIC:  Alert and oriented x 3 PSYCHIATRIC:  Normal affect   ASSESSMENT:    1. Palpitations   2. Mitral valve prolapse   3. Hyperlipidemia, unspecified  hyperlipidemia type    PLAN:    In order of problems listed above:  #Palpitations: Kardia device with episode that looks like Afib. Follow-up 3 day cardiac monitor with 1 run of NSVT lasting 6 beats and rare nonsustained SVT (5 episodes which look irregular and are concerning for brief episodes of Afib). Rare PVCs and SVE. TTE with normal LVEF 60-65%, late systolic MV prolapse with mild to moderate MR, normal RV. Currently, continues to have intermittent palps. Given high concern for Afib given Kardia device findings, will check 2 week zio to evaluate further. Patient is amenable to start nodal agents and AC if needed. -Check 2 week zio -Willing to start nodal agents and AC if needed  #MVP with mild-to-moderate MR: Noted on TTE 03/2022.  -Continue serial monitoring with repeat TTE 03/2023 unless clinical change  #HLD: LDL 131. Not on cholesterol lowering therapy. Will check Ca score for further risk stratification. -Check Ca score        Medication Adjustments/Labs and Tests Ordered: Current medicines are reviewed at length with the patient today.  Concerns regarding medicines are outlined above.  Orders Placed This Encounter  Procedures   CT CARDIAC SCORING (SELF PAY ONLY)   LONG TERM MONITOR (3-14 DAYS)   ECHOCARDIOGRAM COMPLETE   No orders of the defined types were placed in this encounter.   Patient Instructions  Medication Instructions:   Your  physician recommends that you continue on your current medications as directed. Please refer to the Current Medication list given to you today.  *If you need a refill on your cardiac medications before your next appointment, please call your pharmacy*   Testing/Procedures:  Your physician has requested that you have an echocardiogram. Echocardiography is a painless test that uses sound waves to create images of your heart. It provides your doctor with information about the size and shape of your heart and how well your heart's chambers and valves are working. This procedure takes approximately one hour. There are no restrictions for this procedure. SCHEDULE ECHO TO BE DONE IN OCTOBER 2024 PER DR. Shari Prows   Please do NOT wear cologne, perfume, aftershave, or lotions (deodorant is allowed). Please arrive 15 minutes prior to your appointment time.   CARDIAC CALCIUM SCORE (SELF PAY)    ZIO XT- Long Term Monitor Instructions  Your physician has requested you wear a ZIO patch monitor for 14 days.  This is a single patch monitor. [employer] supplies one patch monitor per enrollment. Additional stickers are not available. Please do not apply patch if you will be having a Nuclear Stress Test,  Echocardiogram, Cardiac CT, MRI, or Chest Xray during the period you would be wearing the  monitor. The patch cannot be worn during these tests. You cannot remove and re-apply the  ZIO XT patch monitor.  Your ZIO patch monitor will be mailed 3 day USPS to your address on file. It may take 3-5 days  to receive your monitor after you have been enrolled.  Once you have received your monitor, please review the enclosed instructions. Your monitor  has already been registered assigning a specific monitor serial # to you.  Billing and Patient Assistance Program Information  We have supplied [employer] with any of your insurance information on file for billing purposes. [employer] offers a sliding scale Patient  Assistance Program for patients that do not have  insurance, or whose insurance does not completely cover the cost of the ZIO monitor.  You must apply for the Patient Assistance Program  to qualify for this discounted rate.  To apply, please call [employer] at [phone], select option 4, select option 2, ask to apply for  Patient Assistance Program. Meredeth Ide will ask your household income, and how many people  are in your household. They will quote your out-of-pocket cost based on that information.  [employer] will also be able to set up a 34-month, interest-free payment plan if needed.  Applying the monitor   Shave hair from upper left chest.  Hold abrader disc by orange tab. Rub abrader in 40 strokes over the upper left chest as  indicated in your monitor instructions.  Clean area with 4 enclosed alcohol pads. Let dry.  Apply patch as indicated in monitor instructions. Patch will be placed under collarbone on left  side of chest with arrow pointing upward.  Rub patch adhesive wings for 2 minutes. Remove white label marked "1". Remove the white  label marked "2". Rub patch adhesive wings for 2 additional minutes.  While looking in a mirror, press and release button in center of patch. A small green light will  flash 3-4 times. This will be your only indicator that the monitor has been turned on.  Do not shower for the first 24 hours. You may shower after the first 24 hours.  Press the button if you feel a symptom. You will hear a small click. Record Date, Time and  Symptom in the Patient Logbook.  When you are ready to remove the patch, follow instructions on the last 2 pages of Patient  Logbook. Stick patch monitor onto the last page of Patient Logbook.  Place Patient Logbook in the blue and white box. Use locking tab on box and tape box closed  securely. The blue and white box has prepaid postage on it. Please place it in the mailbox as  soon as possible. Your physician should have your test  results approximately 7 days after the  monitor has been mailed back to [facility].  Call [facility] Customer Care at [phone] if you have questions regarding  your ZIO XT patch monitor. Call them immediately if you see an orange light blinking on your  monitor.  If your monitor falls off in less than 4 days, contact our Monitor department at [phone].  If your monitor becomes loose or falls off after 4 days call [employer] at [phone] for  suggestions on securing your monitor   Follow-Up: At [facility], you and your health needs are our priority.  As part of our continuing mission to provide you with exceptional heart care, we have created designated Provider Care Teams.  These Care Teams include your primary Cardiologist (physician) and Advanced Practice Providers (APPs -  Physician Assistants and Nurse Practitioners) who all work together to provide you with the care you need, when you need it.  We recommend signing up for the patient portal called "MyChart".  Sign up information is provided on this After Visit Summary.  MyChart is used to connect with patients for Virtual Visits (Telemedicine).  Patients are able to view lab/test results, encounter notes, upcoming appointments, etc.  Non-urgent messages can be sent to your provider as well.   To learn more about what you can do with MyChart, go to [email or web address].    Your next appointment:   6 month(s)  The format for your next appointment:   In Person  Provider:   DR. Shari Prows           Signed, Meriam Sprague, MD  04/22/2022 4:41 PM    [facility]

## 2022-04-21 ENCOUNTER — Other Ambulatory Visit: Payer: Self-pay | Admitting: Student

## 2022-04-21 DIAGNOSIS — Z1231 Encounter for screening mammogram for malignant neoplasm of breast: Secondary | ICD-10-CM

## 2022-04-22 ENCOUNTER — Telehealth: Payer: Self-pay | Admitting: *Deleted

## 2022-04-22 ENCOUNTER — Other Ambulatory Visit: Payer: Self-pay | Admitting: Student

## 2022-04-22 ENCOUNTER — Other Ambulatory Visit: Payer: Self-pay | Admitting: *Deleted

## 2022-04-22 ENCOUNTER — Encounter: Payer: Self-pay | Admitting: Cardiology

## 2022-04-22 ENCOUNTER — Ambulatory Visit: Payer: 59 | Attending: Cardiology

## 2022-04-22 ENCOUNTER — Ambulatory Visit: Payer: 59 | Attending: Cardiology | Admitting: Cardiology

## 2022-04-22 VITALS — BP 126/78 | HR 79 | Ht 62.0 in | Wt 138.6 lb

## 2022-04-22 DIAGNOSIS — Z1231 Encounter for screening mammogram for malignant neoplasm of breast: Secondary | ICD-10-CM

## 2022-04-22 DIAGNOSIS — I341 Nonrheumatic mitral (valve) prolapse: Secondary | ICD-10-CM

## 2022-04-22 DIAGNOSIS — R002 Palpitations: Secondary | ICD-10-CM

## 2022-04-22 DIAGNOSIS — E785 Hyperlipidemia, unspecified: Secondary | ICD-10-CM

## 2022-04-22 NOTE — Telephone Encounter (Signed)
-----   Message from Ernst Bowler sent at 04/22/2022  4:23 PM EST ----- Regarding: RE: 14 DAY ZIO PER DR. Shari Prows done ----- Message ----- From: Loa Socks, LPN Sent: [id number]/76/1470   3:47 PM EST To: Ernst Bowler; Katrina Claria Dice Subject: 14 DAY ZIO PER DR. Shari Prows                   14 day zio ordered for palpitations  Please enroll and let me know when you do?  Thanks [employer]

## 2022-04-22 NOTE — Patient Instructions (Signed)
Medication Instructions:   Your physician recommends that you continue on your current medications as directed. Please refer to the Current Medication list given to you today.  *If you need a refill on your cardiac medications before your next appointment, please call your pharmacy*   Testing/Procedures:  Your physician has requested that you have an echocardiogram. Echocardiography is a painless test that uses sound waves to create images of your heart. It provides your doctor with information about the size and shape of your heart and how well your heart's chambers and valves are working. This procedure takes approximately one hour. There are no restrictions for this procedure. SCHEDULE ECHO TO BE DONE IN OCTOBER 2024 PER DR. Shari Prows   Please do NOT wear cologne, perfume, aftershave, or lotions (deodorant is allowed). Please arrive 15 minutes prior to your appointment time.   CARDIAC CALCIUM SCORE (SELF PAY)    ZIO XT- Long Term Monitor Instructions  Your physician has requested you wear a ZIO patch monitor for 14 days.  This is a single patch monitor. [employer] supplies one patch monitor per enrollment. Additional stickers are not available. Please do not apply patch if you will be having a Nuclear Stress Test,  Echocardiogram, Cardiac CT, MRI, or Chest Xray during the period you would be wearing the  monitor. The patch cannot be worn during these tests. You cannot remove and re-apply the  ZIO XT patch monitor.  Your ZIO patch monitor will be mailed 3 day USPS to your address on file. It may take 3-5 days  to receive your monitor after you have been enrolled.  Once you have received your monitor, please review the enclosed instructions. Your monitor  has already been registered assigning a specific monitor serial # to you.  Billing and Patient Assistance Program Information  We have supplied [employer] with any of your insurance information on file for billing purposes. [employer]  offers a sliding scale Patient Assistance Program for patients that do not have  insurance, or whose insurance does not completely cover the cost of the ZIO monitor.  You must apply for the Patient Assistance Program to qualify for this discounted rate.  To apply, please call [employer] at [phone], select option 4, select option 2, ask to apply for  Patient Assistance Program. Meredeth Ide will ask your household income, and how many people  are in your household. They will quote your out-of-pocket cost based on that information.  [employer] will also be able to set up a 37-month, interest-free payment plan if needed.  Applying the monitor   Shave hair from upper left chest.  Hold abrader disc by orange tab. Rub abrader in 40 strokes over the upper left chest as  indicated in your monitor instructions.  Clean area with 4 enclosed alcohol pads. Let dry.  Apply patch as indicated in monitor instructions. Patch will be placed under collarbone on left  side of chest with arrow pointing upward.  Rub patch adhesive wings for 2 minutes. Remove white label marked "1". Remove the white  label marked "2". Rub patch adhesive wings for 2 additional minutes.  While looking in a mirror, press and release button in center of patch. A small green light will  flash 3-4 times. This will be your only indicator that the monitor has been turned on.  Do not shower for the first 24 hours. You may shower after the first 24 hours.  Press the button if you feel a symptom. You will hear a small click. Record  Date, Time and  Symptom in the Patient Logbook.  When you are ready to remove the patch, follow instructions on the last 2 pages of Patient  Logbook. Stick patch monitor onto the last page of Patient Logbook.  Place Patient Logbook in the blue and white box. Use locking tab on box and tape box closed  securely. The blue and white box has prepaid postage on it. Please place it in the mailbox as  soon as possible. Your  physician should have your test results approximately 7 days after the  monitor has been mailed back to [facility].  Call [facility] Customer Care at [phone] if you have questions regarding  your ZIO XT patch monitor. Call them immediately if you see an orange light blinking on your  monitor.  If your monitor falls off in less than 4 days, contact our Monitor department at [phone].  If your monitor becomes loose or falls off after 4 days call [employer] at [phone] for  suggestions on securing your monitor   Follow-Up: At [facility], you and your health needs are our priority.  As part of our continuing mission to provide you with exceptional heart care, we have created designated Provider Care Teams.  These Care Teams include your primary Cardiologist (physician) and Advanced Practice Providers (APPs -  Physician Assistants and Nurse Practitioners) who all work together to provide you with the care you need, when you need it.  We recommend signing up for the patient portal called "MyChart".  Sign up information is provided on this After Visit Summary.  MyChart is used to connect with patients for Virtual Visits (Telemedicine).  Patients are able to view lab/test results, encounter notes, upcoming appointments, etc.  Non-urgent messages can be sent to your provider as well.   To learn more about what you can do with MyChart, go to [email or web address].    Your next appointment:   6 month(s)  The format for your next appointment:   In Person  Provider:   DR. Shari Prows

## 2022-04-22 NOTE — Progress Notes (Unsigned)
Enrolled for [employer] to mail a ZIO XT long term holter monitor to the patients address on file.  

## 2022-04-25 DIAGNOSIS — R002 Palpitations: Secondary | ICD-10-CM

## 2022-05-06 ENCOUNTER — Ambulatory Visit: Payer: 59

## 2022-05-06 DIAGNOSIS — N812 Incomplete uterovaginal prolapse: Secondary | ICD-10-CM | POA: Diagnosis not present

## 2022-05-06 DIAGNOSIS — Z4689 Encounter for fitting and adjustment of other specified devices: Secondary | ICD-10-CM | POA: Diagnosis not present

## 2022-05-14 ENCOUNTER — Ambulatory Visit
Admission: RE | Admit: 2022-05-14 | Discharge: 2022-05-14 | Disposition: A | Discharge status: Home / Self Care | Payer: Self-pay | Source: Ambulatory Visit | Attending: Cardiology | Admitting: Cardiology

## 2022-05-14 DIAGNOSIS — E785 Hyperlipidemia, unspecified: Secondary | ICD-10-CM | POA: Insufficient documentation

## 2022-05-15 ENCOUNTER — Ambulatory Visit
Admission: RE | Admit: 2022-05-15 | Discharge: 2022-05-15 | Disposition: A | Discharge status: Home / Self Care | Payer: 59 | Source: Ambulatory Visit

## 2022-05-15 DIAGNOSIS — Z1231 Encounter for screening mammogram for malignant neoplasm of breast: Secondary | ICD-10-CM | POA: Diagnosis not present

## 2022-05-22 ENCOUNTER — Telehealth: Payer: Self-pay | Admitting: *Deleted

## 2022-05-22 ENCOUNTER — Encounter: Payer: Self-pay | Admitting: *Deleted

## 2022-05-22 DIAGNOSIS — I341 Nonrheumatic mitral (valve) prolapse: Secondary | ICD-10-CM

## 2022-05-22 DIAGNOSIS — R9431 Abnormal electrocardiogram [ECG] [EKG]: Secondary | ICD-10-CM

## 2022-05-22 DIAGNOSIS — R002 Palpitations: Secondary | ICD-10-CM

## 2022-05-22 DIAGNOSIS — I471 Supraventricular tachycardia, unspecified: Secondary | ICD-10-CM

## 2022-05-22 DIAGNOSIS — I4729 Other ventricular tachycardia: Secondary | ICD-10-CM

## 2022-05-22 MED ORDER — METOPROLOL SUCCINATE ER 25 MG PO TB24: 25.0000 mg | ORAL_TABLET | Freq: Every day | ORAL | 2 refills | Status: DC

## 2022-05-22 NOTE — Telephone Encounter (Signed)
The patient has been notified of the result and verbalized understanding.  All questions (if any) were answered.  Pt aware to start taking Toprol XL 25 mg po daily.  Confirmed the pharmacy of choice with the pt.    Pt aware we will order a Cardiac MRI for her to have done, to rule out infiltrative disease.  She is aware she will need labs for this test, and we will order for her to get a BMET and Mg level and hemoglobin and hematocrit done prior to CMR.  Pt just had her TSH levels done by her PCP and this is in epic and was completely normal.  Pt does not want this checked again, so will only repeat bmet, hemoglobin and hematocrit, and Mg level prior to CMR.  Pt aware I will place the order for CMR in the system and send a message to our scheduler to call her back and arrange for this test to be done.  Will send CMR instructions to her mychart account to have a refer to prior to this test.   Pt wanted to endorse to the scheduling team that she will not be able to do this from 12/22-12/27 due to being out of town.  Will endorse this information to them.  Scheduled the pt to come in for a follow-up OV with Dr. Shari Prows on 06/18/22 at 0940.  She is aware to arrive 15 mins prior to this appt.   Pt verbalized understanding and agrees with this plan.

## 2022-05-22 NOTE — Telephone Encounter (Signed)
-----   Message from Quintella Reichert, MD sent at 05/21/2022  5:50 PM EST ----- Heart monitor showed multiple episodes of NSVT as well as SVT that is c/w PAT and no afib.  Recommend starting Toprol XL 25mg  daily.  He had a coronary Ca score done last week that has not reported - please get to get this read.  Normal 2D echo recently but with all the NSVT I think he needs a cardiac MRI to rule out infiltrative disease.  He will need a BMET prior to cMRI with gadolineum. Have him come in for BMET, Mag and TSH. Further testing pending results of coronary Ca score.  Followup with Dr. Rockwell Alexandria in 2-3 weeks

## 2022-05-26 ENCOUNTER — Encounter: Payer: Self-pay | Admitting: Cardiology

## 2022-06-16 ENCOUNTER — Other Ambulatory Visit
Admission: RE | Admit: 2022-06-16 | Discharge: 2022-06-16 | Disposition: A | Discharge status: Home / Self Care | Payer: Medicare HMO | Attending: Cardiology | Admitting: Cardiology

## 2022-06-16 ENCOUNTER — Ambulatory Visit: Payer: Medicare HMO

## 2022-06-16 DIAGNOSIS — I4729 Other ventricular tachycardia: Secondary | ICD-10-CM | POA: Diagnosis not present

## 2022-06-16 DIAGNOSIS — I471 Supraventricular tachycardia, unspecified: Secondary | ICD-10-CM | POA: Diagnosis not present

## 2022-06-16 DIAGNOSIS — I341 Nonrheumatic mitral (valve) prolapse: Secondary | ICD-10-CM | POA: Insufficient documentation

## 2022-06-16 DIAGNOSIS — R002 Palpitations: Secondary | ICD-10-CM | POA: Diagnosis not present

## 2022-06-16 DIAGNOSIS — R9431 Abnormal electrocardiogram [ECG] [EKG]: Secondary | ICD-10-CM | POA: Diagnosis not present

## 2022-06-16 LAB — BASIC METABOLIC PANEL
Anion gap: 9 (ref 5–15)
BUN: 18 mg/dL (ref 8–23)
CO2: 26 mmol/L (ref 22–32)
Calcium: 9.2 mg/dL (ref 8.9–10.3)
Chloride: 103 mmol/L (ref 98–111)
Creatinine, Ser: 0.68 mg/dL (ref 0.44–1.00)
GFR, Estimated: >60 mL/min (ref >60)
Glucose, Bld: 79 mg/dL (ref 70–99)
Potassium: 4.6 mmol/L (ref 3.5–5.1)
Sodium: 138 mmol/L (ref 135–145)

## 2022-06-16 LAB — MAGNESIUM: Magnesium: 2.1 mg/dL (ref 1.7–2.4)

## 2022-06-16 LAB — HEMOGLOBIN AND HEMATOCRIT, BLOOD
HCT: 36.5 % (ref 36.0–46.0)
Hemoglobin: 12.1 g/dL (ref 12.0–15.0)

## 2022-06-16 NOTE — Addendum Note (Signed)
Addended by: Tarius Stangelo C on: 06/16/2022 08:42 AM   Modules accepted: Orders  

## 2022-06-16 NOTE — Addendum Note (Signed)
Addended by: Marty Heck on: 06/16/2022 08:42 AM   Modules accepted: Orders

## 2022-06-18 ENCOUNTER — Other Ambulatory Visit: Payer: Self-pay

## 2022-06-18 ENCOUNTER — Ambulatory Visit: Payer: Commercial Managed Care - PPO | Admitting: Cardiology

## 2022-06-18 DIAGNOSIS — I341 Nonrheumatic mitral (valve) prolapse: Secondary | ICD-10-CM

## 2022-06-18 DIAGNOSIS — R002 Palpitations: Secondary | ICD-10-CM

## 2022-06-18 DIAGNOSIS — I471 Supraventricular tachycardia, unspecified: Secondary | ICD-10-CM

## 2022-06-18 DIAGNOSIS — I4729 Other ventricular tachycardia: Secondary | ICD-10-CM

## 2022-06-18 DIAGNOSIS — R9431 Abnormal electrocardiogram [ECG] [EKG]: Secondary | ICD-10-CM

## 2022-06-18 MED ORDER — METOPROLOL SUCCINATE ER 25 MG PO TB24: 25.0000 mg | ORAL_TABLET | Freq: Every day | ORAL | 3 refills | Status: DC

## 2022-06-20 ENCOUNTER — Other Ambulatory Visit: Payer: Self-pay

## 2022-06-20 DIAGNOSIS — R002 Palpitations: Secondary | ICD-10-CM

## 2022-06-20 DIAGNOSIS — I471 Supraventricular tachycardia, unspecified: Secondary | ICD-10-CM

## 2022-06-20 DIAGNOSIS — I4729 Other ventricular tachycardia: Secondary | ICD-10-CM

## 2022-06-20 DIAGNOSIS — R9431 Abnormal electrocardiogram [ECG] [EKG]: Secondary | ICD-10-CM

## 2022-06-20 DIAGNOSIS — I341 Nonrheumatic mitral (valve) prolapse: Secondary | ICD-10-CM

## 2022-06-20 MED ORDER — METOPROLOL SUCCINATE ER 25 MG PO TB24: 25.0000 mg | ORAL_TABLET | Freq: Every day | ORAL | 3 refills | Status: DC

## 2022-06-23 ENCOUNTER — Telehealth (HOSPITAL_COMMUNITY): Payer: Self-pay | Admitting: Emergency Medicine

## 2022-06-23 NOTE — Telephone Encounter (Signed)
Reaching out to patient to offer assistance regarding upcoming cardiac imaging study; pt verbalizes understanding of appt date/time, parking situation and where to check in, pre-test NPO status and medications ordered, and verified current allergies; name and call back number provided for further questions should they arise Marchia Bond RN Navigator Cardiac Imaging [facility] [phone] office [phone] cell  Arrival 14 [address] Denies claustro Denies metal  Denies iv issues

## 2022-06-25 ENCOUNTER — Other Ambulatory Visit: Payer: Self-pay | Admitting: Cardiology

## 2022-06-25 ENCOUNTER — Ambulatory Visit
Admission: RE | Admit: 2022-06-25 | Discharge: 2022-06-25 | Disposition: A | Discharge status: Home / Self Care | Payer: Medicare HMO | Source: Ambulatory Visit | Attending: Cardiology | Admitting: Cardiology

## 2022-06-25 DIAGNOSIS — I471 Supraventricular tachycardia, unspecified: Secondary | ICD-10-CM | POA: Insufficient documentation

## 2022-06-25 DIAGNOSIS — I341 Nonrheumatic mitral (valve) prolapse: Secondary | ICD-10-CM | POA: Insufficient documentation

## 2022-06-25 DIAGNOSIS — R002 Palpitations: Secondary | ICD-10-CM

## 2022-06-25 DIAGNOSIS — R9431 Abnormal electrocardiogram [ECG] [EKG]: Secondary | ICD-10-CM | POA: Diagnosis not present

## 2022-06-25 DIAGNOSIS — I4729 Other ventricular tachycardia: Secondary | ICD-10-CM

## 2022-06-25 MED ORDER — GADOBUTROL 1 MMOL/ML IV SOLN
9.0000 mL | Freq: Once | INTRAVENOUS | Status: AC | PRN
  Administered 2022-06-25: 9 mL via INTRAVENOUS

## 2022-07-12 NOTE — Progress Notes (Unsigned)
Cardiology Office Note:    Date:  07/14/2022   ID:  Charlotte Villa, DOB October 04, 1950, MRN [id number]  PCP:  Glendale Chard, DO   [facility] Providers Cardiologist:  None {   Referring MD: Glendale Chard, DO    History of Present Illness:    Charlotte Villa is a 72 y.o. female with a hx of mitral valve prolapse and palpitations who presents to clinic for follow-up.  Cardiac monitor 04/2022 with 6 beat run of NSVT. 5 episodes of nonsustained SVT and rare ectopy. TTE 03/2022 with LVEF 60-65%, G1DD, normal RV and RVSP, late systolic bileaflet MV prolapse with mild to moderate MR.  Was initially seen in clinic on 04/2022 for episodes of palpitations with Kardia device revealing possible Afib. Cardiac monitor showed Atach with longest episode lasting 18s. There with multiple episodes of NSVT with longest lasting 10 beats.  Ca score 0. CMR with normal LVEF 57%, no LGE, mild bileaflet prolapse with mild MR, normal RV.  Today, the patient overall feels much better. Palpitations have significantly improved with metoprolol. No chest pain, SOB, orthopnea or PND. Has resumed exercise and feels well with exertion. Blood pressure is well controlled and at goal.    Past Medical History:  Diagnosis Date   Drusen of macula    Electrolyte abnormality    Left hip pain    Osteopenia    Palpitations    Presbyopia     No past surgical history on file.  Current Medications: Current Meds  Medication Sig   acetaminophen (TYLENOL) 650 MG CR tablet Take 650 mg by mouth every morning.   metoprolol succinate (TOPROL XL) 25 MG 24 hr tablet Take 1 tablet (25 mg total) by mouth daily.     Allergies:   Patient has no known allergies.   Social History   Socioeconomic History   Marital status: Divorced    Spouse name: Not on file   Number of children: Not on file   Years of education: Not on file   Highest education level: Not on file  Occupational History   Not on file  Tobacco Use    Smoking status: Never    Passive exposure: Never   Smokeless tobacco: Never  Substance and Sexual Activity   Alcohol use: Not on file   Drug use: Not on file   Sexual activity: Not on file  Other Topics Concern   Not on file  Social History Narrative   Not on file   Social Determinants of Health   Financial Resource Strain: Not on file  Food Insecurity: Not on file  Transportation Needs: Not on file  Physical Activity: Not on file  Stress: Not on file  Social Connections: Not on file     Family History: The patient's family history is not on file.  ROS:   Please see the history of present illness.     All other systems reviewed and are negative.  EKGs/Labs/Other Studies Reviewed:    The following studies were reviewed today: CMR June 22, 2022: FINDINGS: 1. Normal left ventricular size, thickness and systolic function (LVEF = 57%). There are no regional wall motion abnormalities.   There is no late gadolinium enhancement in the left ventricular myocardium.   Normal native LV T1 value of 998 ml   Slightly elevated ECV 31%   LVEDV: 171 ml   LVESV: 75 ml   SV: 97 ml   CO: 4.3  L/min   2. Normal right ventricular size, thickness and  systolic function (RVEF = 56%). There are no regional wall motion abnormalities.   3.  Normal left and right atrial size.   4. Normal size of the aortic root, ascending aorta and pulmonary artery. No evidence for shunting. Normal Qp:Qs of 1.02   5. Bileaflet mitral valve prolapse, mild mitral regurgitation. Trivial TR.   6.  Normal pericardium.  Trivial pericardial effusion.   IMPRESSION: 1. Normal LV size and function.  LVEF 57%.   2. There is no late gadolinium enhancement in the left ventricular myocardium.   3. Normal native T1 value with no evidence for infiltrative myocardial disease.   4. Bileaflet mitral valve prolapse with mild mitral regurgitation   5. Normal RV size and function.   Cardiac Monitor 05/2022:    Predominant rhythm was normal sinus rhythm with average heart rate 82bpm.   Multiple episodes of nonsustained VT with longest episode lasting 10 beats and fastest episode at 214bpm.   Multiple episodes of SVT c/w atrial tachycardia with longest episode lasting 18 sec and fastest episode at 187bpm.   Rare PACs, atrial couplest and triplets   Rare PVCs, ventricular couplest, triplets and ventricular bigeminy and trigeminy   Patient's symptoms corresponded to patient's SVT events.  Ca score 05/2022: IMPRESSION AND RECOMMENDATION: 1. Normal coronary calcium score of 0.  Patient is low risk.   2. CAC 0, CAC-DRS A0.   3. Continue heart healthy lifestyle and risk factor modification.    TTE 03/2022: IMPRESSIONS   1. Late systolic bileaflet prolapse. Mild to moderate MR. The mitral  valve is abnormal. Mild to moderate mitral valve regurgitation. No  evidence of mitral stenosis. There is moderate late systolic prolapse of  both leaflets of the mitral valve.   2. Left ventricular ejection fraction, by estimation, is 60 to 65%. Left  ventricular ejection fraction by 2D MOD biplane is 61.3 %. The left  ventricle has normal function. The left ventricle has no regional wall  motion abnormalities. Left ventricular  diastolic parameters are consistent with Grade I diastolic dysfunction  (impaired relaxation).   3. Right ventricular systolic function is normal. The right ventricular  size is normal. There is normal pulmonary artery systolic pressure. The  estimated right ventricular systolic pressure is 36.1 mmHg.   4. The aortic valve is tricuspid. Aortic valve regurgitation is not  visualized. No aortic stenosis is present.   5. The inferior vena cava is normal in size with greater than 50%  respiratory variability, suggesting right atrial pressure of 3 mmHg.   Cardiac Monitor 04/11/22:   Predominately sinus rhythm   1 brief run of nonsustained VT lasting 6 beats with avg HR of 139   Rare ( 5  episodes ) of supraventricular tachycardia, brief episodes .  fastest episode was 11 beats at rate if 169   Rare PVCs, PACs     Patch Wear Time:  2 days and 23 hours (2023-10-24T06:58:47-0400 to 2023-10-27T06:37:45-0400)   Patient had a min HR of 53 bpm, max HR of 169 bpm, and avg HR of 79 bpm. Predominant underlying rhythm was Sinus Rhythm. 1 run of Ventricular Tachycardia occurred lasting 6 beats with a max rate of 154 bpm (avg 139 bpm). 5 Supraventricular Tachycardia  runs occurred, the run with the fastest interval lasting 11 beats with a max rate of 169 bpm, the longest lasting 14 beats with an avg rate of 134 bpm. Isolated SVEs were rare (<1.0%), SVE Couplets were rare (<1.0%), and SVE Triplets were rare (<1.0%).  Isolated VEs were occasional (3.1%, 10227), VE Couplets were rare (<1.0%, 789), and VE Triplets were rare (<1.0%, 25). Ventricular Bigeminy and Trigeminy were present.   EKG:  No new tracing today  Recent Labs: 11/25/2021: ALT 11 03/27/2022: Platelets 230; TSH 1.690 06/16/2022: BUN 18; Creatinine, Ser 0.68; Hemoglobin 12.1; Magnesium 2.1; Potassium 4.6; Sodium 138  Recent Lipid Panel    Component Value Date/Time   CHOL 236 (H) 12/25/2020 1609   TRIG 120 12/25/2020 1609   HDL 84 12/25/2020 1609   CHOLHDL 2.8 12/25/2020 1609   LDLCALC 131 (H) 12/25/2020 1609     Risk Assessment/Calculations:                Physical Exam:    VS:  BP 124/78   Pulse 78   Ht 5\' 2"  (1.575 m)   Wt 144 lb 9.6 oz (65.6 kg)   SpO2 98%   BMI 26.45 kg/m     Wt Readings from Last 3 Encounters:  07/14/22 144 lb 9.6 oz (65.6 kg)  04/22/22 138 lb 9.6 oz (62.9 kg)  03/27/22 141 lb 6.4 oz (64.1 kg)     GEN:  Well nourished, well developed in no acute distress HEENT: Normal NECK: No JVD; No carotid bruits CARDIAC: RRR, 2/6 late systolic murmur RESPIRATORY:  Clear to auscultation without rales, wheezing or rhonchi  ABDOMEN: Soft, non-tender, non-distended MUSCULOSKELETAL:  No edema; No  deformity  SKIN: Warm and dry NEUROLOGIC:  Alert and oriented x 3 PSYCHIATRIC:  Normal affect   ASSESSMENT:    1. Palpitations   2. NSVT (nonsustained ventricular tachycardia) ([place])   3. Mitral valve prolapse   4. SVT (supraventricular tachycardia)   5. Hyperlipidemia, unspecified hyperlipidemia type     PLAN:    In order of problems listed above:  #Palpitations: #Atach: #NSVT: Cardiac monitor without afib but with episodes of Atach as well as runs of NSVT. CMR with normal LVEF, mild MVP with mild MR, normal RV. Currently doing much better with significant improvement of palpitations on metoprolol.  -Continue metop 25mg  XL daily  #MVP with mild MR: Noted on TTE 03/2022. CMR with mild MR 06/2022 -Continue serial monitoring 06/2023  #HLD: Ca score 0. Working on lifestyle modifications.      Medication Adjustments/Labs and Tests Ordered: Current medicines are reviewed at length with the patient today.  Concerns regarding medicines are outlined above.  No orders of the defined types were placed in this encounter.  No orders of the defined types were placed in this encounter.   Patient Instructions  Medication Instructions:   Your physician recommends that you continue on your current medications as directed. Please refer to the Current Medication list given to you today.  *If you need a refill on your cardiac medications before your next appointment, please call your pharmacy*    Follow-Up: At [facility], you and your health needs are our priority.  As part of our continuing mission to provide you with exceptional heart care, we have created designated Provider Care Teams.  These Care Teams include your primary Cardiologist (physician) and Advanced Practice Providers (APPs -  Physician Assistants and Nurse Practitioners) who all work together to provide you with the care you need, when you need it.  We recommend signing up for the patient portal called  "MyChart".  Sign up information is provided on this After Visit Summary.  MyChart is used to connect with patients for Virtual Visits (Telemedicine).  Patients are able to view lab/test results,  encounter notes, upcoming appointments, etc.  Non-urgent messages can be sent to your provider as well.   To learn more about what you can do with MyChart, go to [email or web address].    Your next appointment:   1 year(s)  Provider:   Dr. Johney Frame     Signed, Freada Bergeron, MD  07/14/2022 4:37 PM    [place]

## 2022-07-14 ENCOUNTER — Encounter: Payer: Self-pay | Admitting: Cardiology

## 2022-07-14 ENCOUNTER — Ambulatory Visit: Payer: Medicare HMO | Attending: Cardiology | Admitting: Cardiology

## 2022-07-14 VITALS — BP 124/78 | HR 78 | Ht 62.0 in | Wt 144.6 lb

## 2022-07-14 DIAGNOSIS — I341 Nonrheumatic mitral (valve) prolapse: Secondary | ICD-10-CM | POA: Diagnosis not present

## 2022-07-14 DIAGNOSIS — E785 Hyperlipidemia, unspecified: Secondary | ICD-10-CM | POA: Diagnosis not present

## 2022-07-14 DIAGNOSIS — R002 Palpitations: Secondary | ICD-10-CM | POA: Diagnosis not present

## 2022-07-14 DIAGNOSIS — I4729 Other ventricular tachycardia: Secondary | ICD-10-CM | POA: Diagnosis not present

## 2022-07-14 DIAGNOSIS — I471 Supraventricular tachycardia, unspecified: Secondary | ICD-10-CM

## 2022-07-14 NOTE — Patient Instructions (Signed)
Medication Instructions:   Your physician recommends that you continue on your current medications as directed. Please refer to the Current Medication list given to you today.  *If you need a refill on your cardiac medications before your next appointment, please call your pharmacy*    Follow-Up: At [facility], you and your health needs are our priority.  As part of our continuing mission to provide you with exceptional heart care, we have created designated Provider Care Teams.  These Care Teams include your primary Cardiologist (physician) and Advanced Practice Providers (APPs -  Physician Assistants and Nurse Practitioners) who all work together to provide you with the care you need, when you need it.  We recommend signing up for the patient portal called "MyChart".  Sign up information is provided on this After Visit Summary.  MyChart is used to connect with patients for Virtual Visits (Telemedicine).  Patients are able to view lab/test results, encounter notes, upcoming appointments, etc.  Non-urgent messages can be sent to your provider as well.   To learn more about what you can do with MyChart, go to [email or web address].    Your next appointment:   1 year(s)  Provider:   Dr. Pemberton   

## 2022-07-31 ENCOUNTER — Encounter: Payer: Self-pay | Admitting: Cardiology

## 2022-08-01 MED ORDER — METOPROLOL SUCCINATE ER 50 MG PO TB24: 50.0000 mg | ORAL_TABLET | Freq: Every day | ORAL | 1 refills | Status: DC

## 2022-08-25 ENCOUNTER — Other Ambulatory Visit: Payer: Self-pay

## 2022-08-25 MED ORDER — METOPROLOL SUCCINATE ER 50 MG PO TB24: 50.0000 mg | ORAL_TABLET | Freq: Every day | ORAL | 3 refills | Status: DC

## 2022-08-29 ENCOUNTER — Other Ambulatory Visit (HOSPITAL_COMMUNITY): Payer: Commercial Managed Care - PPO

## 2022-10-28 ENCOUNTER — Ambulatory Visit: Payer: Medicare Other | Admitting: Cardiology

## 2023-02-05 ENCOUNTER — Telehealth (HOSPITAL_COMMUNITY): Payer: Self-pay | Admitting: Cardiology

## 2023-02-05 NOTE — Telephone Encounter (Signed)
Patient called and cancelled echocardiogram due to she has [place] and no longer needs. Order will be removed from the echo WQ. Thank you.

## 2023-03-24 ENCOUNTER — Other Ambulatory Visit (HOSPITAL_COMMUNITY): Payer: Medicare Other

## 2023-07-30 ENCOUNTER — Other Ambulatory Visit: Payer: Self-pay

## 2023-07-30 MED ORDER — METOPROLOL SUCCINATE ER 50 MG PO TB24: 50.0000 mg | ORAL_TABLET | Freq: Every day | ORAL | 0 refills | Status: AC

## 2023-08-20 ENCOUNTER — Other Ambulatory Visit: Payer: Self-pay | Admitting: Physician Assistant

## 2023-12-02 IMAGING — CR DG HIP (WITH OR WITHOUT PELVIS) 3-4V BILAT
3 series · 3 of 3 positions shown · non-contrast
Comparison: None Available.

CLINICAL DATA: hip pain

EXAM:
DG HIP (WITH OR WITHOUT PELVIS) 3-4V BILAT

[w pelvis upright]
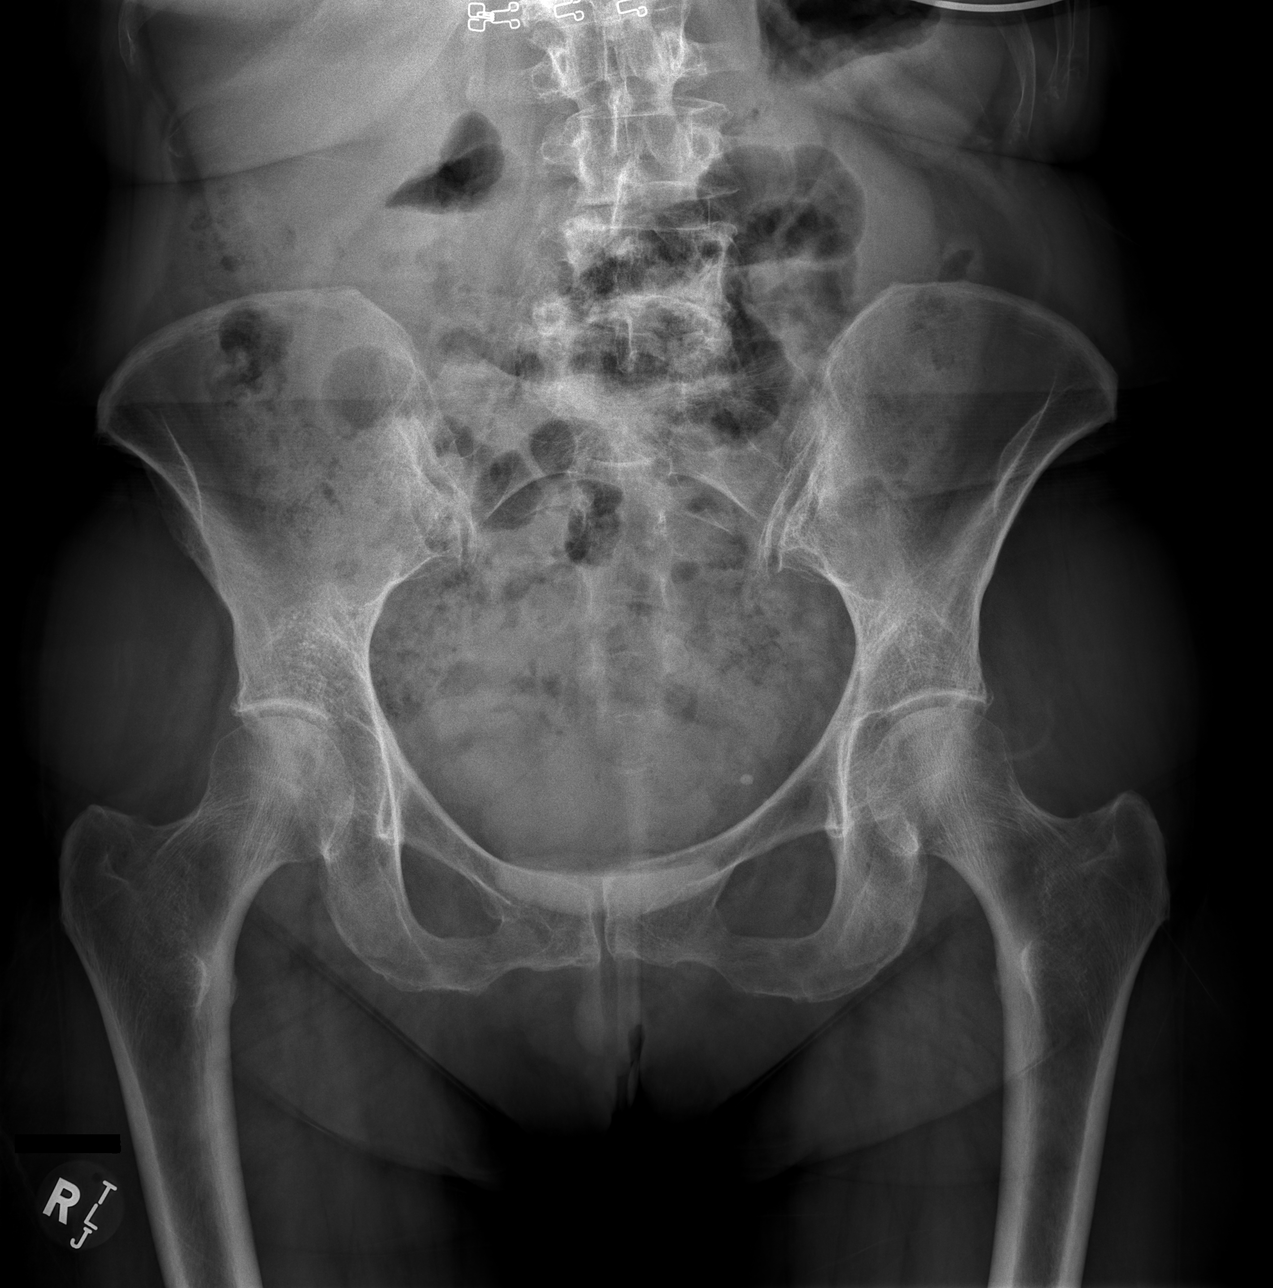

[w hip frog left]
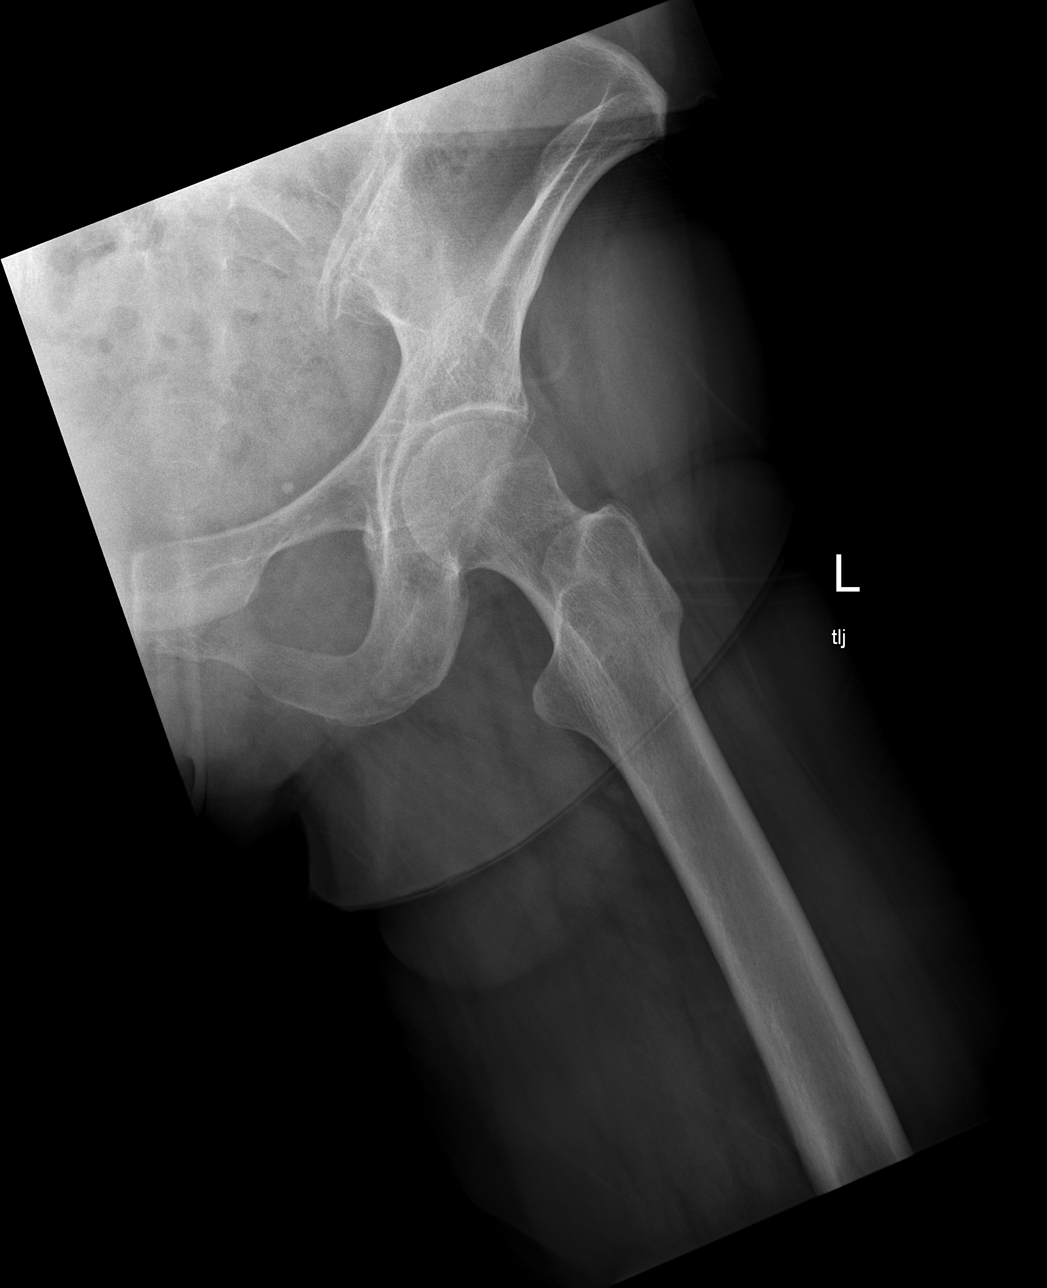

[w hip frog right]
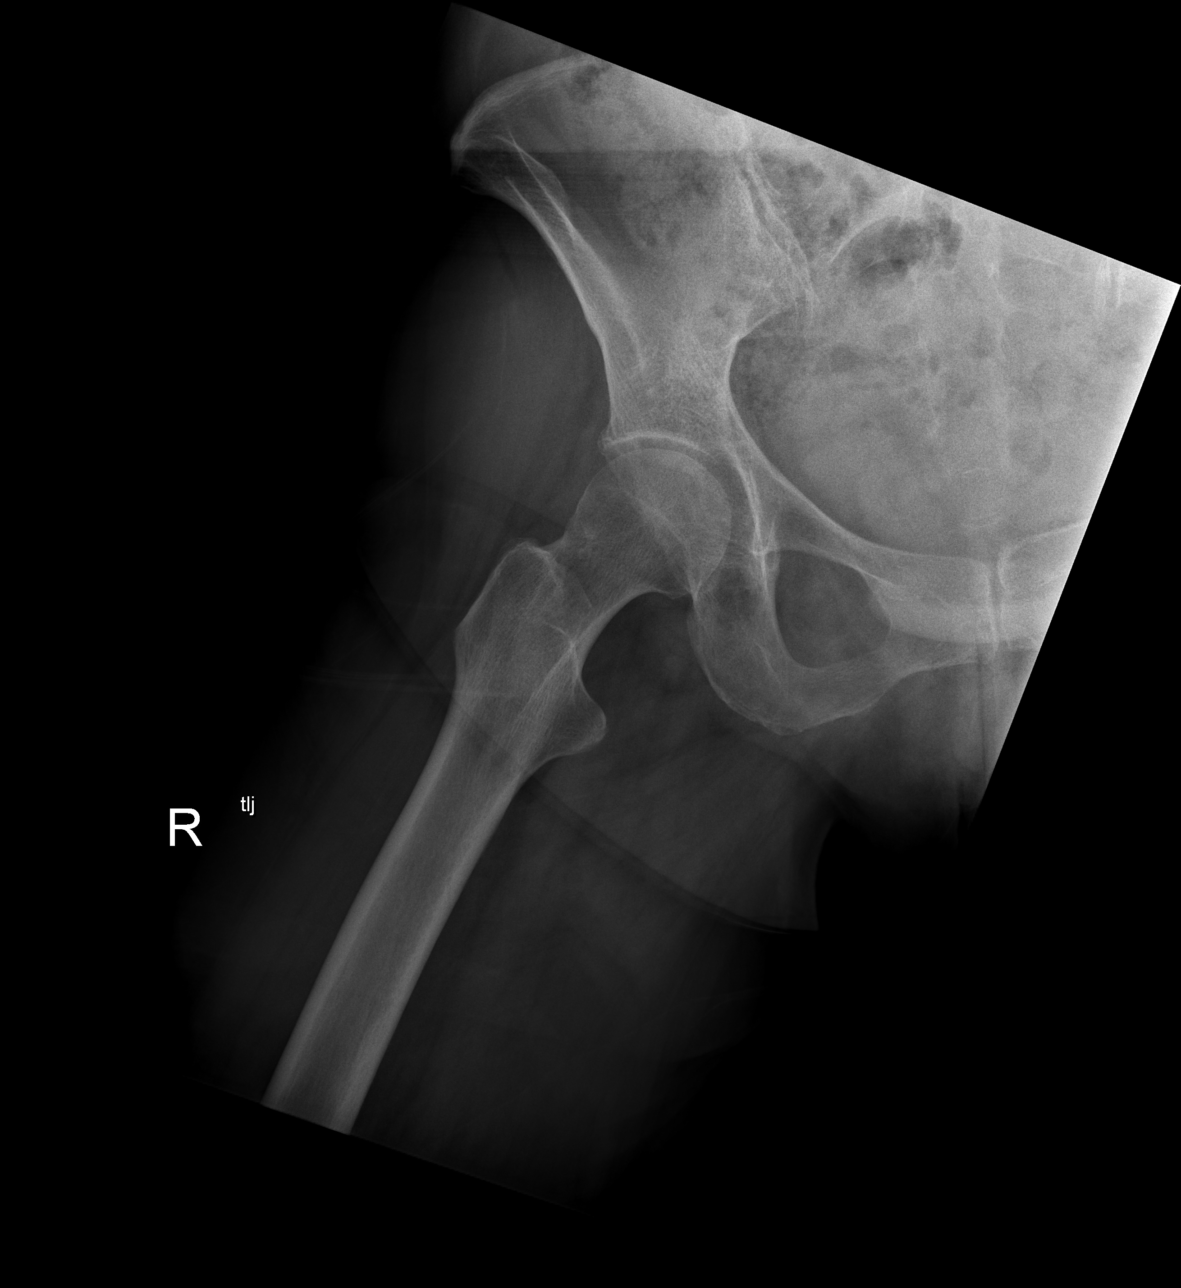

[3 of 3 positions shown; findings below may reference images not displayed]

FINDINGS: There is no evidence of hip fracture or dislocation of the hips. No
acute displaced fracture or diastasis of the bones of pelvis. No
aggressive appearing osseous lesion. Degenerative changes of the
visualized lower lumbar spine.
IMPRESSION: Negative for acute traumatic injury.
